# Patient Record
Sex: Female | Born: 1980 | Race: White | Hispanic: No | Marital: Married | Smoking: Former smoker
Health system: Southern US, Community
[De-identification: ages and names within clinical notes are randomized; demographics above are authoritative.]

## PROBLEM LIST (undated history)

## (undated) DIAGNOSIS — I10 Essential (primary) hypertension: Secondary | ICD-10-CM

## (undated) DIAGNOSIS — K219 Gastro-esophageal reflux disease without esophagitis: Secondary | ICD-10-CM

## (undated) DIAGNOSIS — D649 Anemia, unspecified: Secondary | ICD-10-CM

## (undated) DIAGNOSIS — E119 Type 2 diabetes mellitus without complications: Secondary | ICD-10-CM

## (undated) HISTORY — PX: TUBAL LIGATION: SHX77

## (undated) HISTORY — PX: ABDOMINAL HYSTERECTOMY: SHX81

## (undated) HISTORY — PX: WISDOM TOOTH EXTRACTION: SHX21

---

## 2005-08-05 ENCOUNTER — Ambulatory Visit: Payer: Self-pay | Admitting: Obstetrics & Gynecology

## 2005-09-15 ENCOUNTER — Observation Stay: Payer: Self-pay

## 2005-10-06 ENCOUNTER — Observation Stay: Payer: Self-pay | Admitting: Unknown Physician Specialty

## 2005-10-17 ENCOUNTER — Inpatient Hospital Stay: Payer: Self-pay | Admitting: Obstetrics & Gynecology

## 2007-01-30 ENCOUNTER — Inpatient Hospital Stay: Payer: Self-pay | Admitting: Obstetrics & Gynecology

## 2008-11-03 ENCOUNTER — Emergency Department: Payer: Self-pay | Admitting: Emergency Medicine

## 2012-10-16 ENCOUNTER — Emergency Department: Payer: Self-pay | Admitting: Emergency Medicine

## 2016-12-13 ENCOUNTER — Encounter: Payer: Self-pay | Admitting: Obstetrics & Gynecology

## 2017-02-01 ENCOUNTER — Telehealth: Payer: Self-pay | Admitting: Obstetrics & Gynecology

## 2017-02-01 NOTE — Telephone Encounter (Signed)
I spoke to the patient and gave Dr. Doreene Adas available June OR dates. She will call back when she decides which date will work. Patient was given my phone# and ext.

## 2017-03-02 NOTE — Telephone Encounter (Signed)
Patient is aware of H&P on 05/03/17 @ 3:50pm with a Pre-Admit phone interview, and OR on 05/11/17.

## 2017-04-04 ENCOUNTER — Encounter: Payer: Self-pay | Admitting: Obstetrics & Gynecology

## 2017-04-04 ENCOUNTER — Ambulatory Visit (INDEPENDENT_AMBULATORY_CARE_PROVIDER_SITE_OTHER): Payer: BC Managed Care – PPO | Admitting: Obstetrics & Gynecology

## 2017-04-04 VITALS — BP 138/90 | HR 106 | Ht 60.0 in | Wt 211.0 lb

## 2017-04-04 DIAGNOSIS — Z1371 Encounter for nonprocreative screening for genetic disease carrier status: Secondary | ICD-10-CM | POA: Diagnosis not present

## 2017-04-04 DIAGNOSIS — Z803 Family history of malignant neoplasm of breast: Secondary | ICD-10-CM | POA: Insufficient documentation

## 2017-04-04 NOTE — Progress Notes (Signed)
  HPI: She has strong family history of breast cancer and ius here to discuss BRCA results.  PMHx: She  has no past medical history on file. Also,  has a past surgical history that includes Tubal ligation and Cesarean section., family history includes Bone cancer in her paternal grandfather; Breast cancer in her maternal grandmother, other, and paternal grandmother; Lung cancer in her maternal grandfather; Ovarian cancer in her maternal aunt.,  reports that she has never smoked. She has never used smokeless tobacco. She reports that she does not drink alcohol or use drugs.  She currently has no medications in their medication list. Also, has No Known Allergies.  Review of Systems  All other systems reviewed and are negative.  Objective: BP 138/90   Pulse (!) 106   Ht 5' (1.524 m)   Wt 211 lb (95.7 kg)   LMP 03/23/2017   BMI 41.21 kg/m   Physical examination Constitutional NAD, Conversant  Skin No rashes, lesions or ulceration.   Extremities: Moves all appropriately.  Normal ROM for age. No lymphadenopathy.  Neuro: Grossly intact  Psych: Oriented to PPT.  Normal mood. Normal affect.   Assessment:  Family history of breast cancer  BRCA gene mutation negative   Plan No further treatments of evaluations needed Hysterectomy soon, OK to preserve ovaries Lifetime risk of 13 % average risk.  A total of 15 minutes were spent face-to-face with the patient during this encounter and over half of that time dealt with counseling and coordination of care.   Barnett Applebaum, MD, [facility], [facility] 04/04/2017  4:37 PM

## 2017-05-03 ENCOUNTER — Ambulatory Visit (INDEPENDENT_AMBULATORY_CARE_PROVIDER_SITE_OTHER): Payer: BC Managed Care – PPO | Admitting: Obstetrics & Gynecology

## 2017-05-03 VITALS — BP 130/80 | HR 90 | Ht 60.0 in | Wt 208.0 lb

## 2017-05-03 DIAGNOSIS — N920 Excessive and frequent menstruation with regular cycle: Secondary | ICD-10-CM | POA: Diagnosis not present

## 2017-05-03 DIAGNOSIS — Z1371 Encounter for nonprocreative screening for genetic disease carrier status: Secondary | ICD-10-CM | POA: Diagnosis not present

## 2017-05-03 NOTE — Progress Notes (Signed)
PRE-OPERATIVE HISTORY AND PHYSICAL EXAM  HPI:  Christie Wright is a 36 y.o. [phone]; she is being admitted for surgery related to abnormal uterine bleeding.  Pt has had freq and heavy periods, refractory to medical therapy.  Has declined ablation or other therapeutic interventions.  PMHx: No past medical history on file. Past Surgical History:  Procedure Laterality Date  . CESAREAN SECTION    . TUBAL LIGATION     Family History  Problem Relation Age of Onset  . Ovarian cancer Maternal Aunt   . Breast cancer Maternal Grandmother   . Lung cancer Maternal Grandfather   . Breast cancer Paternal Grandmother   . Bone cancer Paternal Grandfather   . Breast cancer Other    Social History  Substance Use Topics  . Smoking status: Never Smoker  . Smokeless tobacco: Never Used  . Alcohol use No   No outpatient prescriptions have been marked as taking for the 05/03/17 encounter (Office Visit) with Gae Dry, MD.   Allergies: Patient has no known allergies.  Review of Systems  Constitutional: Negative for chills, fever and malaise/fatigue.  HENT: Negative for congestion, sinus pain and sore throat.   Eyes: Negative for blurred vision and pain.  Respiratory: Negative for cough and wheezing.   Cardiovascular: Negative for chest pain and leg swelling.  Gastrointestinal: Negative for abdominal pain, constipation, diarrhea, heartburn, nausea and vomiting.  Genitourinary: Negative for dysuria, frequency, hematuria and urgency.  Musculoskeletal: Negative for back pain, joint pain, myalgias and neck pain.  Skin: Negative for itching and rash.  Neurological: Negative for dizziness, tremors and weakness.  Endo/Heme/Allergies: Does not bruise/bleed easily.  Psychiatric/Behavioral: Negative for depression. The patient is not nervous/anxious and does not have insomnia.     Objective: BP 130/80   Pulse 90   Ht 5' (1.524 m)   Wt 208 lb (94.3 kg)   BMI 40.62 kg/m   Filed Weights   05/03/17 1608  Weight: 208 lb (94.3 kg)   Physical Exam  Constitutional: She is oriented to person, place, and time. She appears well-developed and well-nourished. No distress.  Obesity  Genitourinary: Rectum normal, vagina normal and uterus normal. Pelvic exam was performed with patient supine. There is no rash or lesion on the right labia. There is no rash or lesion on the left labia. Vagina exhibits no lesion. No bleeding in the vagina. Right adnexum does not display mass and does not display tenderness. Left adnexum does not display mass and does not display tenderness. Cervix does not exhibit motion tenderness, lesion, friability or polyp.   Uterus is mobile and midaxial. Uterus is not enlarged or exhibiting a mass.  HENT:  Head: Normocephalic and atraumatic. Head is without laceration.  Right Ear: Hearing normal.  Left Ear: Hearing normal.  Nose: No epistaxis.  No foreign bodies.  Mouth/Throat: Uvula is midline, oropharynx is clear and moist and mucous membranes are normal.  Eyes: Pupils are equal, round, and reactive to light.  Neck: Normal range of motion. Neck supple. No thyromegaly present.  Cardiovascular: Normal rate and regular rhythm.  Exam reveals no gallop and no friction rub.   No murmur heard. Pulmonary/Chest: Effort normal and breath sounds normal. No respiratory distress. She has no wheezes. Right breast exhibits no mass, no skin change and no tenderness. Left breast exhibits no mass, no skin change and no tenderness.  Abdominal: Soft. Bowel sounds are normal. She exhibits no distension. There is no tenderness. There is no rebound.  Musculoskeletal: Normal range of motion.  Neurological: She is alert and oriented to person, place, and time. No cranial nerve deficit.  Skin: Skin is warm and dry.  Psychiatric: She has a normal mood and affect. Judgment normal.  Vitals reviewed.   Assessment: 1. Menorrhagia with regular cycle   2. BRCA gene mutation negative   Plan  TLH, BS, Cysto.  Alternative options discussed.  I have had a careful discussion with this patient about all the options available and the risk/benefits of each. I have fully informed this patient that surgery may subject her to a variety of discomforts and risks: She understands that most patients have surgery with little difficulty, but problems can happen ranging from minor to fatal. These include nausea, vomiting, pain, bleeding, infection, poor healing, hernia, or formation of adhesions. Unexpected reactions may occur from any drug or anesthetic given. Unintended injury may occur to other pelvic or abdominal structures such as Fallopian tubes, ovaries, bladder, ureter (tube from kidney to bladder), or bowel. Nerves going from the pelvis to the legs may be injured. Any such injury may require immediate or later additional surgery to correct the problem. Excessive blood loss requiring transfusion is very unlikely but possible. Dangerous blood clots may form in the legs or lungs. Physical and sexual activity will be restricted in varying degrees for an indeterminate period of time but most often 2-6 weeks.  Finally, she understands that it is impossible to list every possible undesirable effect and that the condition for which surgery is done is not always cured or significantly improved, and in rare cases may be even worse.Ample time was given to answer all questions.  Barnett Applebaum, MD, [facility], [facility] 05/03/2017  5:05 PM

## 2017-05-04 ENCOUNTER — Encounter
Admission: RE | Admit: 2017-05-04 | Discharge: 2017-05-04 | Disposition: A | Discharge status: Home / Self Care | Payer: BC Managed Care – PPO | Source: Ambulatory Visit | Attending: Obstetrics & Gynecology | Admitting: Obstetrics & Gynecology

## 2017-05-04 ENCOUNTER — Encounter: Payer: Self-pay | Admitting: *Deleted

## 2017-05-04 HISTORY — DX: Anemia, unspecified: D64.9

## 2017-05-04 HISTORY — DX: Gastro-esophageal reflux disease without esophagitis: K21.9

## 2017-05-04 NOTE — Patient Instructions (Signed)
  Your procedure is scheduled on: 05-11-17 THURSDAY Report to Same Day Surgery 2nd floor medical mall [facility] Entrance-take elevator on left to 2nd floor.  Check in with surgery information desk.) To find out your arrival time please call [phone] between 1PM - 3PM on  05-10-17 [facility]  Remember: Instructions that are not followed completely may result in serious medical risk, up to and including death, or upon the discretion of your surgeon and anesthesiologist your surgery may need to be rescheduled.    _x___ 1. Do not eat food or drink liquids after midnight. No gum chewing or hard candies.     __x__ 2. No Alcohol for 24 hours before or after surgery.   __x__3. No Smoking for 24 prior to surgery.   ____  4. Bring all medications with you on the day of surgery if instructed.    __x__ 5. Notify your doctor if there is any change in your medical condition     (cold, fever, infections).     Do not wear jewelry, make-up, hairpins, clips or nail polish.  Do not wear lotions, powders, or perfumes. You may wear deodorant.  Do not shave 48 hours prior to surgery. Men may shave face and neck.  Do not bring valuables to the hospital.    [facility] is not responsible for any belongings or valuables.               Contacts, dentures or bridgework may not be worn into surgery.  Leave your suitcase in the car. After surgery it may be brought to your room.  For patients admitted to the hospital, discharge time is determined by your treatment team.   Patients discharged the day of surgery will not be allowed to drive home.  You will need someone to drive you home and stay with you the night of your procedure.    Please read over the following fact sheets that you were given:      ____ Take anti-hypertensive (unless it includes a diuretic), cardiac, seizure, asthma,     anti-reflux and psychiatric medicines. These include:  1. NONE  2.  3.  4.  5.  6.  ____Fleets enema or  Magnesium Citrate as directed.   _x___ Use CHG Soap or sage wipes as directed on instruction sheet   ____ Use inhalers on the day of surgery and bring to hospital day of surgery  ____ Stop Metformin and Janumet 2 days prior to surgery.    ____ Take 1/2 of usual insulin dose the night before surgery and none on the morning  surgery.   ____ Follow recommendations from Cardiologist, Pulmonologist or PCP regarding stopping Aspirin, Coumadin, Pllavix ,Eliquis, Effient, or Pradaxa, and Pletal.  X____Stop Anti-inflammatories such as Advil, Aleve, Ibuprofen, MOTRIN, Naproxen, Naprosyn, Goodies powders or aspirin products NOW-OK to take Tylenol    ____ Stop supplements until after surgery.    ____ Bring C-Pap to the hospital.

## 2017-05-10 ENCOUNTER — Encounter
Admission: RE | Admit: 2017-05-10 | Discharge: 2017-05-10 | Disposition: A | Discharge status: Home / Self Care | Payer: BC Managed Care – PPO | Source: Ambulatory Visit | Attending: Obstetrics & Gynecology | Admitting: Obstetrics & Gynecology

## 2017-05-10 DIAGNOSIS — D259 Leiomyoma of uterus, unspecified: Secondary | ICD-10-CM | POA: Diagnosis not present

## 2017-05-10 DIAGNOSIS — N920 Excessive and frequent menstruation with regular cycle: Secondary | ICD-10-CM | POA: Diagnosis present

## 2017-05-10 DIAGNOSIS — N72 Inflammatory disease of cervix uteri: Secondary | ICD-10-CM | POA: Diagnosis not present

## 2017-05-10 DIAGNOSIS — Z803 Family history of malignant neoplasm of breast: Secondary | ICD-10-CM | POA: Diagnosis not present

## 2017-05-10 DIAGNOSIS — N838 Other noninflammatory disorders of ovary, fallopian tube and broad ligament: Secondary | ICD-10-CM | POA: Diagnosis not present

## 2017-05-10 LAB — TYPE AND SCREEN
ABO/RH(D): A POS
ANTIBODY SCREEN: NEGATIVE

## 2017-05-10 LAB — CBC
HCT: 40.7 % (ref 35.0–47.0)
HEMOGLOBIN: 13.9 g/dL (ref 12.0–16.0)
MCH: 30.3 pg (ref 26.0–34.0)
MCHC: 34.2 g/dL (ref 32.0–36.0)
MCV: 88.7 fL (ref 80.0–100.0)
PLATELETS: 270 10*3/uL (ref 150–440)
RBC: 4.59 MIL/uL (ref 3.80–5.20)
RDW: 12.8 % (ref 11.5–14.5)
WBC: 9 10*3/uL (ref 3.6–11.0)

## 2017-05-10 MED ORDER — CEFOXITIN SODIUM-DEXTROSE 2-2.2 GM-% IV SOLR (PREMIX)
2.0000 g | Freq: Once | INTRAVENOUS | Status: AC
  Administered 2017-05-11: 2 g via INTRAVENOUS

## 2017-05-11 ENCOUNTER — Ambulatory Visit: Payer: BC Managed Care – PPO | Admitting: Registered Nurse

## 2017-05-11 ENCOUNTER — Ambulatory Visit
Admission: RE | Admit: 2017-05-11 | Discharge: 2017-05-11 | Disposition: A | Discharge status: Home / Self Care | Payer: BC Managed Care – PPO | Source: Ambulatory Visit | Attending: Obstetrics & Gynecology | Admitting: Obstetrics & Gynecology

## 2017-05-11 ENCOUNTER — Encounter
Admission: RE | Disposition: A | Discharge status: Home / Self Care | Payer: Self-pay | Source: Ambulatory Visit | Attending: Obstetrics & Gynecology

## 2017-05-11 DIAGNOSIS — D259 Leiomyoma of uterus, unspecified: Secondary | ICD-10-CM | POA: Insufficient documentation

## 2017-05-11 DIAGNOSIS — N72 Inflammatory disease of cervix uteri: Secondary | ICD-10-CM | POA: Insufficient documentation

## 2017-05-11 DIAGNOSIS — Z803 Family history of malignant neoplasm of breast: Secondary | ICD-10-CM | POA: Insufficient documentation

## 2017-05-11 DIAGNOSIS — N920 Excessive and frequent menstruation with regular cycle: Secondary | ICD-10-CM | POA: Diagnosis not present

## 2017-05-11 DIAGNOSIS — N838 Other noninflammatory disorders of ovary, fallopian tube and broad ligament: Secondary | ICD-10-CM | POA: Insufficient documentation

## 2017-05-11 HISTORY — PX: CYSTOSCOPY: SHX5120

## 2017-05-11 HISTORY — PX: TOTAL LAPAROSCOPIC HYSTERECTOMY WITH SALPINGECTOMY: SHX6742

## 2017-05-11 LAB — POCT PREGNANCY, URINE: Preg Test, Ur: NEGATIVE

## 2017-05-11 LAB — ABO/RH: ABO/RH(D): A POS

## 2017-05-11 SURGERY — HYSTERECTOMY, TOTAL, LAPAROSCOPIC, WITH SALPINGECTOMY
Anesthesia: General | Laterality: Bilateral

## 2017-05-11 MED ORDER — SUGAMMADEX SODIUM 200 MG/2ML IV SOLN
INTRAVENOUS | Status: DC | PRN
  Administered 2017-05-11: 190 mg via INTRAVENOUS

## 2017-05-11 MED ORDER — LIDOCAINE HCL (PF) 2 % IJ SOLN
INTRAMUSCULAR | Status: AC
  Filled 2017-05-11: qty 2

## 2017-05-11 MED ORDER — FENTANYL CITRATE (PF) 250 MCG/5ML IJ SOLN
INTRAMUSCULAR | Status: AC
  Filled 2017-05-11: qty 5

## 2017-05-11 MED ORDER — FAMOTIDINE 20 MG PO TABS
ORAL_TABLET | ORAL | Status: AC
  Filled 2017-05-11: qty 1

## 2017-05-11 MED ORDER — PROMETHAZINE HCL 25 MG/ML IJ SOLN
INTRAMUSCULAR | Status: DC
Start: 2017-05-11 — End: 2017-05-11
  Filled 2017-05-11: qty 1

## 2017-05-11 MED ORDER — SUCCINYLCHOLINE CHLORIDE 20 MG/ML IJ SOLN
INTRAMUSCULAR | Status: AC
  Filled 2017-05-11: qty 1

## 2017-05-11 MED ORDER — CEFOXITIN SODIUM-DEXTROSE 2-2.2 GM-% IV SOLR (PREMIX)
INTRAVENOUS | Status: AC
  Filled 2017-05-11: qty 50

## 2017-05-11 MED ORDER — ONDANSETRON HCL 4 MG/2ML IJ SOLN
INTRAMUSCULAR | Status: DC | PRN
  Administered 2017-05-11: 4 mg via INTRAVENOUS

## 2017-05-11 MED ORDER — ACETAMINOPHEN NICU IV SYRINGE 10 MG/ML
INTRAVENOUS | Status: AC
  Filled 2017-05-11: qty 1

## 2017-05-11 MED ORDER — FENTANYL CITRATE (PF) 100 MCG/2ML IJ SOLN
INTRAMUSCULAR | Status: AC
  Administered 2017-05-11: 50 ug via INTRAVENOUS
  Filled 2017-05-11: qty 2

## 2017-05-11 MED ORDER — PROPOFOL 10 MG/ML IV BOLUS
INTRAVENOUS | Status: DC | PRN
  Administered 2017-05-11: 180 mg via INTRAVENOUS

## 2017-05-11 MED ORDER — LACTATED RINGERS IV SOLN
INTRAVENOUS | Status: DC
  Administered 2017-05-11 (×2): via INTRAVENOUS

## 2017-05-11 MED ORDER — OXYCODONE HCL 5 MG PO TABS
5.0000 mg | ORAL_TABLET | Freq: Once | ORAL | Status: AC | PRN
  Administered 2017-05-11: 5 mg via ORAL

## 2017-05-11 MED ORDER — OXYCODONE HCL 5 MG PO TABS
ORAL_TABLET | ORAL | Status: AC
  Filled 2017-05-11: qty 1

## 2017-05-11 MED ORDER — SUGAMMADEX SODIUM 200 MG/2ML IV SOLN
INTRAVENOUS | Status: AC
  Filled 2017-05-11: qty 2

## 2017-05-11 MED ORDER — DEXAMETHASONE SODIUM PHOSPHATE 10 MG/ML IJ SOLN
INTRAMUSCULAR | Status: DC | PRN
  Administered 2017-05-11: 10 mg via INTRAVENOUS

## 2017-05-11 MED ORDER — PROMETHAZINE HCL 25 MG/ML IJ SOLN
6.2500 mg | INTRAMUSCULAR | Status: DC | PRN
  Administered 2017-05-11: 6.25 mg via INTRAVENOUS

## 2017-05-11 MED ORDER — FENTANYL CITRATE (PF) 100 MCG/2ML IJ SOLN
25.0000 ug | INTRAMUSCULAR | Status: DC | PRN
  Administered 2017-05-11 (×3): 50 ug via INTRAVENOUS

## 2017-05-11 MED ORDER — BUPIVACAINE HCL (PF) 0.5 % IJ SOLN
INTRAMUSCULAR | Status: DC | PRN
  Administered 2017-05-11: 12 mL

## 2017-05-11 MED ORDER — KETOROLAC TROMETHAMINE 30 MG/ML IJ SOLN: 30.0000 mg | Freq: Four times a day (QID) | INTRAMUSCULAR | Status: DC

## 2017-05-11 MED ORDER — MIDAZOLAM HCL 2 MG/2ML IJ SOLN
INTRAMUSCULAR | Status: AC
  Filled 2017-05-11: qty 2

## 2017-05-11 MED ORDER — EPHEDRINE SULFATE 50 MG/ML IJ SOLN
INTRAMUSCULAR | Status: AC
  Filled 2017-05-11: qty 1

## 2017-05-11 MED ORDER — OXYCODONE HCL 5 MG/5ML PO SOLN: 5.0000 mg | Freq: Once | ORAL | Status: AC | PRN

## 2017-05-11 MED ORDER — ROCURONIUM BROMIDE 50 MG/5ML IV SOLN
INTRAVENOUS | Status: AC
  Filled 2017-05-11: qty 1

## 2017-05-11 MED ORDER — MEPERIDINE HCL 50 MG/ML IJ SOLN: 6.2500 mg | INTRAMUSCULAR | Status: DC | PRN

## 2017-05-11 MED ORDER — LIDOCAINE HCL (CARDIAC) 20 MG/ML IV SOLN
INTRAVENOUS | Status: DC | PRN
  Administered 2017-05-11: 80 mg via INTRAVENOUS

## 2017-05-11 MED ORDER — ACETAMINOPHEN 650 MG RE SUPP
650.0000 mg | RECTAL | Status: DC | PRN
  Filled 2017-05-11: qty 1

## 2017-05-11 MED ORDER — PHENYLEPHRINE HCL 10 MG/ML IJ SOLN
INTRAMUSCULAR | Status: AC
  Filled 2017-05-11: qty 1

## 2017-05-11 MED ORDER — MIDAZOLAM HCL 2 MG/2ML IJ SOLN
INTRAMUSCULAR | Status: DC | PRN
  Administered 2017-05-11: 2 mg via INTRAVENOUS

## 2017-05-11 MED ORDER — MORPHINE SULFATE (PF) 4 MG/ML IV SOLN: 1.0000 mg | INTRAVENOUS | Status: DC | PRN

## 2017-05-11 MED ORDER — OXYCODONE-ACETAMINOPHEN 5-325 MG PO TABS: 1.0000 | ORAL_TABLET | ORAL | 0 refills | Status: DC | PRN

## 2017-05-11 MED ORDER — ONDANSETRON HCL 4 MG/2ML IJ SOLN
INTRAMUSCULAR | Status: AC
  Filled 2017-05-11: qty 2

## 2017-05-11 MED ORDER — DEXAMETHASONE SODIUM PHOSPHATE 10 MG/ML IJ SOLN
INTRAMUSCULAR | Status: AC
  Filled 2017-05-11: qty 1

## 2017-05-11 MED ORDER — PROPOFOL 10 MG/ML IV BOLUS
INTRAVENOUS | Status: AC
  Filled 2017-05-11: qty 20

## 2017-05-11 MED ORDER — BUPIVACAINE HCL (PF) 0.5 % IJ SOLN
INTRAMUSCULAR | Status: AC
  Filled 2017-05-11: qty 30

## 2017-05-11 MED ORDER — FAMOTIDINE 20 MG PO TABS
20.0000 mg | ORAL_TABLET | Freq: Once | ORAL | Status: AC
  Administered 2017-05-11: 20 mg via ORAL

## 2017-05-11 MED ORDER — ACETAMINOPHEN 325 MG PO TABS: 650.0000 mg | ORAL_TABLET | ORAL | Status: DC | PRN

## 2017-05-11 MED ORDER — FENTANYL CITRATE (PF) 100 MCG/2ML IJ SOLN
INTRAMUSCULAR | Status: DC | PRN
  Administered 2017-05-11 (×4): 50 ug via INTRAVENOUS

## 2017-05-11 MED ORDER — ACETAMINOPHEN 10 MG/ML IV SOLN
INTRAVENOUS | Status: DC | PRN
  Administered 2017-05-11: 1000 mg via INTRAVENOUS

## 2017-05-11 MED ORDER — ROCURONIUM BROMIDE 100 MG/10ML IV SOLN
INTRAVENOUS | Status: DC | PRN
  Administered 2017-05-11: 5 mg via INTRAVENOUS
  Administered 2017-05-11: 35 mg via INTRAVENOUS
  Administered 2017-05-11: 10 mg via INTRAVENOUS

## 2017-05-11 MED ORDER — SODIUM CHLORIDE 0.9 % IJ SOLN
INTRAMUSCULAR | Status: AC
  Filled 2017-05-11: qty 20

## 2017-05-11 MED ORDER — SUCCINYLCHOLINE CHLORIDE 20 MG/ML IJ SOLN
INTRAMUSCULAR | Status: DC | PRN
  Administered 2017-05-11: 100 mg via INTRAVENOUS

## 2017-05-11 SURGICAL SUPPLY — 49 items
BAG URO DRAIN 2000ML W/SPOUT (MISCELLANEOUS) ×4 IMPLANT
BLADE SURG SZ11 CARB STEEL (BLADE) ×4 IMPLANT
CANISTER SUCT 1200ML W/VALVE (MISCELLANEOUS) ×4 IMPLANT
CATH FOLEY 2WAY  5CC 16FR (CATHETERS) ×2
CATH URTH 16FR FL 2W BLN LF (CATHETERS) ×2 IMPLANT
CHLORAPREP W/TINT 26ML (MISCELLANEOUS) ×4 IMPLANT
DEFOGGER SCOPE WARMER CLEARIFY (MISCELLANEOUS) ×4 IMPLANT
DERMABOND ADVANCED (GAUZE/BANDAGES/DRESSINGS) ×2
DERMABOND ADVANCED .7 DNX12 (GAUZE/BANDAGES/DRESSINGS) ×2 IMPLANT
DEVICE SUTURE ENDOST 10MM (ENDOMECHANICALS) ×4 IMPLANT
DRAPE CAMERA CLOSED 9X96 (DRAPES) ×4 IMPLANT
DRSG TEGADERM 2-3/8X2-3/4 SM (GAUZE/BANDAGES/DRESSINGS) IMPLANT
ENDOSTITCH 0 SINGLE 48 (SUTURE) ×20 IMPLANT
GAUZE SPONGE NON-WVN 2X2 STRL (MISCELLANEOUS) IMPLANT
GLOVE BIO SURGEON STRL SZ8 (GLOVE) ×20 IMPLANT
GLOVE INDICATOR 8.0 STRL GRN (GLOVE) ×20 IMPLANT
GOWN STRL REUS W/ TWL LRG LVL3 (GOWN DISPOSABLE) ×2 IMPLANT
GOWN STRL REUS W/ TWL XL LVL3 (GOWN DISPOSABLE) ×4 IMPLANT
GOWN STRL REUS W/TWL LRG LVL3 (GOWN DISPOSABLE) ×2
GOWN STRL REUS W/TWL XL LVL3 (GOWN DISPOSABLE) ×4
GRASPER SUT TROCAR 14GX15 (MISCELLANEOUS) ×4 IMPLANT
IRRIGATION STRYKERFLOW (MISCELLANEOUS) ×2 IMPLANT
IRRIGATOR STRYKERFLOW (MISCELLANEOUS) ×4
IV LACTATED RINGERS 1000ML (IV SOLUTION) ×4 IMPLANT
KIT PINK PAD W/HEAD ARE REST (MISCELLANEOUS) ×4
KIT PINK PAD W/HEAD ARM REST (MISCELLANEOUS) ×2 IMPLANT
KIT RM TURNOVER CYSTO AR (KITS) ×4 IMPLANT
LABEL OR SOLS (LABEL) ×4 IMPLANT
MANIPULATOR VCARE LG CRV RETR (MISCELLANEOUS) ×4 IMPLANT
MANIPULATOR VCARE SML CRV RETR (MISCELLANEOUS) IMPLANT
MANIPULATOR VCARE STD CRV RETR (MISCELLANEOUS) IMPLANT
NEEDLE VERESS 14GA 120MM (NEEDLE) ×4 IMPLANT
NS IRRIG 500ML POUR BTL (IV SOLUTION) ×4 IMPLANT
OCCLUDER COLPOPNEUMO (BALLOONS) ×4 IMPLANT
PACK GYN LAPAROSCOPIC (MISCELLANEOUS) ×4 IMPLANT
PAD OB MATERNITY 4.3X12.25 (PERSONAL CARE ITEMS) ×4 IMPLANT
PAD PREP 24X41 OB/GYN DISP (PERSONAL CARE ITEMS) ×4 IMPLANT
SCISSORS METZENBAUM CVD 33 (INSTRUMENTS) ×4 IMPLANT
SET CYSTO W/LG BORE CLAMP LF (SET/KITS/TRAYS/PACK) ×4 IMPLANT
SHEARS HARMONIC ACE PLUS 36CM (ENDOMECHANICALS) ×4 IMPLANT
SLEEVE ENDOPATH XCEL 5M (ENDOMECHANICALS) ×4 IMPLANT
SPONGE LAP 18X18 5 PK (GAUZE/BANDAGES/DRESSINGS) IMPLANT
SPONGE VERSALON 2X2 STRL (MISCELLANEOUS)
SUT VIC AB 0 CT1 36 (SUTURE) ×4 IMPLANT
SYR 50ML LL SCALE MARK (SYRINGE) ×4 IMPLANT
SYRINGE 10CC LL (SYRINGE) ×4 IMPLANT
TROCAR ENDO BLADELESS 11MM (ENDOMECHANICALS) ×4 IMPLANT
TROCAR XCEL NON-BLD 5MMX100MML (ENDOMECHANICALS) ×4 IMPLANT
TUBING INSUF HEATED (TUBING) ×4 IMPLANT

## 2017-05-11 NOTE — Anesthesia Preprocedure Evaluation (Signed)
Anesthesia Evaluation  Patient identified by MRN, date of birth, ID band Patient awake    Reviewed: Allergy & Precautions, NPO status , Patient's Chart, lab work & pertinent test results  History of Anesthesia Complications Negative for: history of anesthetic complications  Airway Mallampati: III  TM Distance: >3 FB Neck ROM: Full    Dental no notable dental hx.    Pulmonary neg sleep apnea, neg COPD, former smoker,    breath sounds clear to auscultation- rhonchi (-) wheezing      Cardiovascular Exercise Tolerance: Good (-) hypertension(-) CAD and (-) Past MI  Rhythm:Regular Rate:Normal - Systolic murmurs and - Diastolic murmurs    Neuro/Psych negative neurological ROS  negative psych ROS   GI/Hepatic Neg liver ROS, GERD  ,  Endo/Other  negative endocrine ROSneg diabetes  Renal/GU negative Renal ROS     Musculoskeletal negative musculoskeletal ROS (+)   Abdominal (+) + obese,   Peds  Hematology  (+) anemia ,   Anesthesia Other Findings Past Medical History: No date: Anemia No date: GERD (gastroesophageal reflux disease)   Reproductive/Obstetrics                             Anesthesia Physical Anesthesia Plan  ASA: II  Anesthesia Plan: General   Post-op Pain Management:    Induction: Intravenous  PONV Risk Score and Plan: 2 and Ondansetron, Dexamethasone, Propofol and Midazolam  Airway Management Planned: Oral ETT  Additional Equipment:   Intra-op Plan:   Post-operative Plan: Extubation in OR  Informed Consent: I have reviewed the patients History and Physical, chart, labs and discussed the procedure including the risks, benefits and alternatives for the proposed anesthesia with the patient or authorized representative who has indicated his/her understanding and acceptance.   Dental advisory given  Plan Discussed with: CRNA and Anesthesiologist  Anesthesia Plan  Comments:         Anesthesia Quick Evaluation

## 2017-05-11 NOTE — Op Note (Signed)
Operative Report:  PRE-OP DIAGNOSIS: menorrhagia   POST-OP DIAGNOSIS: menorrhagia   PROCEDURE: Procedure(s): HYSTERECTOMY TOTAL LAPAROSCOPIC WITH SALPINGECTOMY CYSTOSCOPY  SURGEON: Barnett Applebaum, MD, FACOG  ASSISTANTGeorgianne Fick   ANESTHESIA: General endotracheal anesthesia  ESTIMATED BLOOD LOSS: less than 50   SPECIMENS: Uterus, Tubes.  COMPLICATIONS: None  DISPOSITION: stable to PACU  FINDINGS: Intraabdominal adhesions were noted. Min adhesions mostly to side wall not involving uterus or tubes.  Ovaries normal.  Cysto normal with patent ureters.  PROCEDURE:  The patient was taken to the OR where anesthesia was administed. She was prepped and draped in the normal sterile fashion in the dorsal lithotomy position in the [place] stirrups. A time out was performed. A Graves speculum was inserted, the cervix was grasped with a single tooth tenaculum and the endometrial cavity was sounded. The cervix was progressively dilated to a size 18 [place] with [employer] dilators. A V-Care uterine manipulator was inserted in the usual fashion without incident. Gloves were changed and attention was turned to the abdomen.   An infraumbilical transverse 43mm skin incision was made with the scalpel after local anesthesia applied to the skin. A Veress-step needle was inserted in the usual fashion and confirmed using the hanging drop technique. A pneumoperitoneum was obtained by insufflation of CO2 (opening pressure of 23mmHg) to 50mmHg. A diagnostic laparoscopy was performed yielding the previously described findings. Attention was turned to the left lower quadrant where after visualization of the inferior epigastric vessels a 32mm skin incision was made with the scalpel. A 5 mm laparoscopic port was inserted. The same procedure was repeated in the right lower quadrant with a 31mm trocar. Attention was turned to the left aspect of the uterus, where after visualization of the ureter, the round ligament was coagulated and  transected using the 69mm Harmonic Scapel. The anterior and posterior leafs of the broad ligament were dissected off as the anterior one was coagulated and transected in a caudal direction towards the cuff of the uterine manipulator.  Attention was then turned to the left fallopian tube which was recognized by visualization of the fimbria. The tube is excised to its attachment to the uterus. The uterine-ovarian ligament and its blood vessels were carefully coagulated and transected using the Harmonic scapel.  Attention was turned to the right aspect of the uterus where the same procedure was performed.  The vesicouterine reflection of the peritoneum was dissected with the harmonic scapel and the bladder flap was created bluntly.  The uterine vessels were coagulated and transected bilaterally using first bipolar cautery and then the harmonic scapel. A 360 degree, circumferential colpotomy was done to completely amputate the uterus with cervix and tubes. Once the specimen was amputated it was delivered through the vagina.   The colpotomy was repaired in a simple interrupted fashion using a 0-Polysorb suture with an endo-stitch device.  Vaginal exam confirms complete closure.  The cavity was copiously irrigated. A survey of the pelvic cavity revealed adequate hemostasis and no injury to bowel, bladder, or ureter.   A diagnostic cystoscopy was performed using saline distension of bladder with no lesions or injuries noted.  Bilateral urine flow from each ureteral orifice is visualized.  At this point the procedure was finalized. All the instruments were removed from the patient's body. Gas was expelled and patient is leveled.  Incisions are closed with skin adhesive.    Patient goes to recovery room in stable condition.  All sponge, instrument, and needle counts are correct x2.     Eddie Dibbles  Kenton Kingfisher, MD, [facility], [facility] 05/11/2017  12:20 PM

## 2017-05-11 NOTE — H&P (Signed)
History and Physical Interval Note:  05/11/2017 8:20 AM  Christie Wright  has presented today for surgery, with the diagnosis of menorrhagia  The various methods of treatment have been discussed with the patient and family. After consideration of risks, benefits and other options for treatment, the patient has consented to  Procedure(s): HYSTERECTOMY TOTAL LAPAROSCOPIC WITH SALPINGECTOMY (Bilateral) as a surgical intervention .  The patient's history has been reviewed, patient examined, no change in status, stable for surgery.  Pt has the following beta blocker history-  Not taking Beta Blocker.  I have reviewed the patient's chart and labs.  Questions were answered to the patient's satisfaction.    Hoyt Koch

## 2017-05-11 NOTE — Discharge Instructions (Addendum)
Total Laparoscopic Hysterectomy, Care After °Refer to this sheet in the next few weeks. These instructions provide you with information on caring for yourself after your procedure. Your health care provider may also give you more specific instructions. Your treatment has been planned according to current medical practices, but problems sometimes occur. Call your health care provider if you have any problems or questions after your procedure. °What can I expect after the procedure? °· Pain and bruising at the incision sites. You will be given pain medicine to control it. °· Menopausal symptoms such as hot flashes, night sweats, and insomnia if your ovaries were removed. °· Sore throat from the breathing tube that was inserted during surgery. °Follow these instructions at home: °· Only take over-the-counter or prescription medicines for pain, discomfort, or fever as directed by your health care provider. °· Do not take aspirin. It can cause bleeding. °· Do not drive when taking pain medicine. °· Follow your health care provider's advice regarding diet, exercise, lifting, driving, and general activities. °· Resume your usual diet as directed and allowed. °· Get plenty of rest and sleep. °· Do not douche, use tampons, or have sexual intercourse for at least 6 weeks, or until your health care provider gives you permission. °· Change your bandages (dressings) as directed by your health care provider. °· Monitor your temperature and notify your health care provider of a fever. °· Take showers instead of baths for 2-3 weeks. °· Do not drink alcohol until your health care provider gives you permission. °· If you develop constipation, you may take a mild laxative with your health care provider's permission. Bran foods may help with constipation problems. Drinking enough fluids to keep your urine clear or pale yellow may help as well. °· Try to have someone home with you for 1-2 weeks to help around the house. °· Keep all of  your follow-up appointments as directed by your health care provider. °Contact a health care provider if: °· You have swelling, redness, or increasing pain around your incision sites. °· You have pus coming from your incision. °· You notice a bad smell coming from your incision. °· Your incision breaks open. °· You feel dizzy or lightheaded. °· You have pain or bleeding when you urinate. °· You have persistent diarrhea. °· You have persistent nausea and vomiting. °· You have abnormal vaginal discharge. °· You have a rash. °· You have any type of abnormal reaction or develop an allergy to your medicine. °· You have poor pain control with your prescribed medicine. °Get help right away if: °· You have chest pain or shortness of breath. °· You have severe abdominal pain that is not relieved with pain medicine. °· You have pain or swelling in your legs. °This information is not intended to replace advice given to you by your health care provider. Make sure you discuss any questions you have with your health care provider. °Document Released: 09/04/2013 Document Revised: 04/21/2016 Document Reviewed: 06/04/2013 °Elsevier Interactive Patient Education © 2017 [employer] ° °AMBULATORY SURGERY  °DISCHARGE INSTRUCTIONS ° ° °1) The drugs that you were given will stay in your system until tomorrow so for the next 24 hours you should not: ° °A) Drive an automobile °B) Make any legal decisions °C) Drink any alcoholic beverage ° ° °2) You may resume regular meals tomorrow.  Today it is better to start with liquids and gradually work up to solid foods. ° °You may eat anything you prefer, but it is better   to start with liquids, then soup and crackers, and gradually work up to solid foods. ° ° °3) Please notify your doctor immediately if you have any unusual bleeding, trouble breathing, redness and pain at the surgery site, drainage, fever, or pain not relieved by medication. ° ° ° °4) Additional Instructions: ° ° ° ° ° ° ° °Please  contact your physician with any problems or Same Day Surgery at [phone], Monday through Friday 6 am to 4 pm, or [place] at [place] Main number at [phone]. ° °

## 2017-05-11 NOTE — OR Nursing (Signed)
Phenergan 6.25mg  IV given for naus/vomiting

## 2017-05-11 NOTE — Transfer of Care (Signed)
Immediate Anesthesia Transfer of Care Note  Patient: Christie Wright  Procedure(s) Performed: Procedure(s): HYSTERECTOMY TOTAL LAPAROSCOPIC WITH SALPINGECTOMY (Bilateral) CYSTOSCOPY  Patient Location: PACU  Anesthesia Type:General  Level of Consciousness: awake, alert  and oriented  Airway & Oxygen Therapy: Patient Spontanous Breathing and Patient connected to face mask oxygen  Post-op Assessment: Report given to RN and Post -op Vital signs reviewed and stable  Post vital signs: Reviewed and stable  Last Vitals:  Vitals:   05/11/17 0743 05/11/17 1231  BP: (!) 141/88 121/79  Pulse: 98 100  Resp: 18 18  Temp: 37.4 C 36.5 C    Last Pain:  Vitals:   05/11/17 0743  TempSrc: Tympanic         Complications: No apparent anesthesia complications

## 2017-05-11 NOTE — Anesthesia Postprocedure Evaluation (Signed)
Anesthesia Post Note  Patient: Christie Wright  Procedure(s) Performed: Procedure(s) (LRB): HYSTERECTOMY TOTAL LAPAROSCOPIC WITH SALPINGECTOMY (Bilateral) CYSTOSCOPY  Patient location during evaluation: PACU Anesthesia Type: General Level of consciousness: awake and alert and oriented Pain management: pain level controlled Vital Signs Assessment: post-procedure vital signs reviewed and stable Respiratory status: spontaneous breathing, nonlabored ventilation and respiratory function stable Cardiovascular status: blood pressure returned to baseline and stable Postop Assessment: no signs of nausea or vomiting Anesthetic complications: no     Last Vitals:  Vitals:   05/11/17 1315 05/11/17 1330  BP: 116/75 (!) 141/78  Pulse: 94 98  Resp: 18 15  Temp:  36.4 C    Last Pain:  Vitals:   05/11/17 1330  TempSrc:   PainSc: 3                  Pratyush Ammon

## 2017-05-11 NOTE — Anesthesia Post-op Follow-up Note (Cosign Needed)
Anesthesia QCDR form completed.        

## 2017-05-11 NOTE — Anesthesia Procedure Notes (Signed)
Procedure Name: Intubation Date/Time: 05/11/2017 10:42 AM Performed by: Hedda Slade Pre-anesthesia Checklist: Patient identified, Patient being monitored, Timeout performed, Emergency Drugs available and Suction available Patient Re-evaluated:Patient Re-evaluated prior to inductionOxygen Delivery Method: Circle system utilized Preoxygenation: Pre-oxygenation with 100% oxygen Intubation Type: IV induction Ventilation: Mask ventilation without difficulty and Oral airway inserted - appropriate to patient size Laryngoscope Size: Mac and 3 Grade View: Grade I Tube type: Oral Tube size: 7.0 mm Number of attempts: 1 Airway Equipment and Method: Stylet Placement Confirmation: ETT inserted through vocal cords under direct vision,  positive ETCO2 and breath sounds checked- equal and bilateral Secured at: 21 cm Tube secured with: Tape Dental Injury: Teeth and Oropharynx as per pre-operative assessment

## 2017-05-11 NOTE — OR Nursing (Signed)
Slept for approx 50 min after phenergan, denies nausea at present, to BR and back to chair, toler well, iv d/c'd and d'c'd to home.

## 2017-05-12 ENCOUNTER — Encounter: Payer: Self-pay | Admitting: Obstetrics & Gynecology

## 2017-05-12 LAB — SURGICAL PATHOLOGY

## 2017-05-18 ENCOUNTER — Ambulatory Visit (INDEPENDENT_AMBULATORY_CARE_PROVIDER_SITE_OTHER): Payer: BC Managed Care – PPO | Admitting: Obstetrics & Gynecology

## 2017-05-18 ENCOUNTER — Encounter: Payer: Self-pay | Admitting: Obstetrics & Gynecology

## 2017-05-18 VITALS — BP 130/90 | HR 96 | Ht 60.0 in | Wt 204.0 lb

## 2017-05-18 DIAGNOSIS — T814XXS Infection following a procedure, sequela: Secondary | ICD-10-CM

## 2017-05-18 DIAGNOSIS — IMO0001 Reserved for inherently not codable concepts without codable children: Secondary | ICD-10-CM

## 2017-05-18 NOTE — Progress Notes (Signed)
  Postoperative Follow-up Patient presents post op from laparoscopic assisted vaginal hysterectomy for abnormal uterine bleeding, 1 week ago.  Left incisional concern today.  Subjective: Patient reports marked improvement in her preop symptoms. Eating a regular diet without difficulty. The patient is not having any pain.  Activity: normal activities of daily living. Patient reports vaginal sx's of None  Objective: BP 130/90   Pulse 96   Ht 5' (1.524 m)   Wt 204 lb (92.5 kg)   LMP 04/20/2017 (Exact Date)   BMI 39.84 kg/m  Physical Exam  Constitutional: She is oriented to person, place, and time. She appears well-developed and well-nourished. No distress.  Cardiovascular: Normal rate.   Pulmonary/Chest: Effort normal.  Abdominal: Soft. She exhibits no distension. There is no tenderness.  Incision Healing Well Left inc w mild inflammation and separation and bruising   Musculoskeletal: Normal range of motion.  Neurological: She is alert and oriented to person, place, and time. No cranial nerve deficit.  Skin: Skin is warm and dry.  Psychiatric: She has a normal mood and affect.    Assessment: s/p :  total laparoscopic hysterectomy with bilateral salpingectomy stable  Plan: Patient has done well after surgery with no apparent complications.  I have discussed the post-operative course to date, and the expected progress moving forward.  The patient understands what complications to be concerned about.  I will see the patient in routine follow up, or sooner if needed.    Activity plan: No heavy lifting. Pelvic rest  Keep inc clean and dry  Hoyt Koch 05/18/2017, 1:27 PM

## 2017-05-24 ENCOUNTER — Ambulatory Visit: Payer: BC Managed Care – PPO | Admitting: Obstetrics & Gynecology

## 2017-06-22 ENCOUNTER — Ambulatory Visit (INDEPENDENT_AMBULATORY_CARE_PROVIDER_SITE_OTHER): Payer: BC Managed Care – PPO | Admitting: Obstetrics & Gynecology

## 2017-06-22 ENCOUNTER — Encounter: Payer: Self-pay | Admitting: Obstetrics & Gynecology

## 2017-06-22 ENCOUNTER — Ambulatory Visit: Payer: BC Managed Care – PPO | Admitting: Obstetrics & Gynecology

## 2017-06-22 VITALS — BP 134/94 | HR 105 | Wt 204.0 lb

## 2017-06-22 VITALS — BP 134/94 | HR 105 | Ht 60.0 in | Wt 204.0 lb

## 2017-06-22 DIAGNOSIS — N920 Excessive and frequent menstruation with regular cycle: Secondary | ICD-10-CM

## 2017-06-22 NOTE — Progress Notes (Signed)
  Postoperative Follow-up Patient presents post op from laparoscopic assisted vaginal hysterectomy for abnormal uterine bleeding, 6 weeks ago.  Subjective: Patient reports marked improvement in her preop symptoms. Eating a regular diet without difficulty. The patient is not having any pain.  Activity: normal activities of daily living. Patient reports vaginal sx's of None  Objective: BP (!) 134/94   Pulse (!) 105   Ht 5' (1.524 m)   Wt 204 lb (92.5 kg)   LMP 04/20/2017 (Exact Date)   BMI 39.84 kg/m  Physical Exam  Constitutional: She is oriented to person, place, and time. She appears well-developed and well-nourished. No distress.  Genitourinary: Rectum normal and vagina normal. Pelvic exam was performed with patient supine. There is no rash, tenderness or lesion on the right labia. There is no rash, tenderness or lesion on the left labia. No erythema or bleeding in the vagina. Right adnexum does not display mass and does not display tenderness. Left adnexum does not display mass and does not display tenderness.  Genitourinary Comments: Cervix and uterus absent. Vaginal cuff healing well.  Cardiovascular: Normal rate.   Pulmonary/Chest: Effort normal.  Abdominal: Soft. She exhibits no distension. There is no tenderness.  Incision healing well.  Musculoskeletal: Normal range of motion.  Neurological: She is alert and oriented to person, place, and time. No cranial nerve deficit.  Skin: Skin is warm and dry.  Psychiatric: She has a normal mood and affect.    Assessment: s/p :  total laparoscopic hysterectomy with bilateral salpingectomy stable  Plan: Patient has done well after surgery with no apparent complications.  I have discussed the post-operative course to date, and the expected progress moving forward.  The patient understands what complications to be concerned about.  I will see the patient in routine follow up, or sooner if needed.    Activity plan: No  restriction.  Hoyt Koch 06/22/2017, 1:38 PM

## 2017-06-23 NOTE — Progress Notes (Signed)
  Postoperative Follow-up Patient presents post op from laparoscopic assisted vaginal hysterectomy for abnormal uterine bleeding, 6 weeks ago.  Subjective: Patient reports marked improvement in her preop symptoms. Eating a regular diet without difficulty. The patient is not having any pain.  Activity: normal activities of daily living. Patient reports vaginal sx's of None  Objective: BP (!) 134/94   Pulse (!) 105   Ht 5' (1.524 m)   Wt 204 lb (92.5 kg)   LMP 04/20/2017 (Exact Date)   BMI 39.84 kg/m  Physical Exam  Constitutional: She is oriented to person, place, and time. She appears well-developed and well-nourished. No distress.  Genitourinary: Rectum normal and vagina normal. Pelvic exam was performed with patient supine. There is no rash, tenderness or lesion on the right labia. There is no rash, tenderness or lesion on the left labia. No erythema or bleeding in the vagina. Right adnexum does not display mass and does not display tenderness. Left adnexum does not display mass and does not display tenderness.  Genitourinary Comments: Cervix and uterus absent. Vaginal cuff healing well.  Cardiovascular: Normal rate.   Pulmonary/Chest: Effort normal.  Abdominal: Soft. She exhibits no distension. There is no tenderness.  Incision healing well.  Musculoskeletal: Normal range of motion.  Neurological: She is alert and oriented to person, place, and time. No cranial nerve deficit.  Skin: Skin is warm and dry.  Psychiatric: She has a normal mood and affect.    Assessment: s/p :  total laparoscopic hysterectomy with bilateral salpingectomy stable  Plan: Patient has done well after surgery with no apparent complications.  I have discussed the post-operative course to date, and the expected progress moving forward.  The patient understands what complications to be concerned about.  I will see the patient in routine follow up, or sooner if needed.    Activity plan: No  restriction.  Hoyt Koch 06/22/2017, 1:38 PM

## 2022-02-21 ENCOUNTER — Other Ambulatory Visit: Payer: Self-pay

## 2022-02-21 ENCOUNTER — Emergency Department: Payer: BC Managed Care – PPO

## 2022-02-21 ENCOUNTER — Emergency Department
Admission: EM | Admit: 2022-02-21 | Discharge: 2022-02-21 | Disposition: A | Discharge status: Home / Self Care | Payer: BC Managed Care – PPO | Attending: Emergency Medicine | Admitting: Emergency Medicine

## 2022-02-21 DIAGNOSIS — I10 Essential (primary) hypertension: Secondary | ICD-10-CM | POA: Diagnosis not present

## 2022-02-21 DIAGNOSIS — R0789 Other chest pain: Secondary | ICD-10-CM | POA: Diagnosis not present

## 2022-02-21 LAB — CBC
HCT: 44.8 % (ref 36.0–46.0)
Hemoglobin: 14.8 g/dL (ref 12.0–15.0)
MCH: 29.9 pg (ref 26.0–34.0)
MCHC: 33 g/dL (ref 30.0–36.0)
MCV: 90.5 fL (ref 80.0–100.0)
Platelets: 287 10*3/uL (ref 150–400)
RBC: 4.95 MIL/uL (ref 3.87–5.11)
RDW: 11.5 % (ref 11.5–15.5)
WBC: 7.8 10*3/uL (ref 4.0–10.5)
nRBC: 0 % (ref 0.0–0.2)

## 2022-02-21 LAB — BASIC METABOLIC PANEL
Anion gap: 10 (ref 5–15)
BUN: 9 mg/dL (ref 6–20)
CO2: 27 mmol/L (ref 22–32)
Calcium: 9.4 mg/dL (ref 8.9–10.3)
Chloride: 101 mmol/L (ref 98–111)
Creatinine, Ser: 0.64 mg/dL (ref 0.44–1.00)
GFR, Estimated: >60 mL/min (ref >60)
Glucose, Bld: 144 mg/dL — ABNORMAL HIGH (ref 70–99)
Potassium: 3.6 mmol/L (ref 3.5–5.1)
Sodium: 138 mmol/L (ref 135–145)

## 2022-02-21 LAB — TROPONIN I (HIGH SENSITIVITY): Troponin I (High Sensitivity): 4 ng/L (ref <18)

## 2022-02-21 NOTE — ED Provider Notes (Signed)
? ?[facility] ?Provider Note ? ? ? Event Date/Time  ? First MD Initiated Contact with Patient 02/21/22 1049   ?  (approximate) ? ? ?History  ? ?Hypertension and Chest Pain ? ? ?HPI ? ?Christie Wright is a 41 y.o. female with a history of anemia, GERD who presents with complaints of elevated blood pressure.  She also reports some very mild chest discomfort which has been intermittent over the last week.  Denies calf pain or swelling.  No nausea or vomiting.  Denies a history of high blood pressure in the past. ?  ? ? ?Physical Exam  ? ?Triage Vital Signs: ?ED Triage Vitals  ?Enc Vitals Group  ?   BP 02/21/22 1041 (!) 142/98  ?   Pulse Rate 02/21/22 1041 93  ?   Resp 02/21/22 1041 16  ?   Temp 02/21/22 1041 98.3 ?F (36.8 ?C)  ?   Temp Source 02/21/22 1041 Oral  ?   SpO2 02/21/22 1041 97 %  ?   Weight 02/21/22 1042 91.6 kg (202 lb)  ?   Height 02/21/22 1042 1.524 m (5')  ?   Head Circumference --   ?   Peak Flow --   ?   Pain Score 02/21/22 1041 0  ?   Pain Loc --   ?   Pain Edu? --   ?   Excl. in [place]? --   ? ? ?Most recent vital signs: ?Vitals:  ? 02/21/22 1041 02/21/22 1207  ?BP: (!) 142/98 126/78  ?Pulse: 93 88  ?Resp: 16 16  ?Temp: 98.3 ?F (36.8 ?C)   ?SpO2: 97%   ? ? ? ?General: Awake, no distress.  ?CV:  Good peripheral perfusion.  No chest wall tenderness palpation ?Resp:  Normal effort.  Clear auscultation bilaterally ?Abd:  No distention.  ?Other:  No lower extremity swelling or edema ? ? ?ED Results / Procedures / Treatments  ? ?Labs ?(all labs ordered are listed, but only abnormal results are displayed) ?Labs Reviewed  ?BASIC METABOLIC PANEL - Abnormal; Notable for the following components:  ?    Result Value  ? Glucose, Bld 144 (*)   ? All other components within normal limits  ?CBC  ?TROPONIN I (HIGH SENSITIVITY)  ?TROPONIN I (HIGH SENSITIVITY)  ? ? ? ?EKG ? ?ED ECG REPORT ?I, Lavonia Drafts, the attending physician, personally viewed and interpreted this ECG. ? ?Date:  02/21/2022 ? ?Rhythm: normal sinus rhythm ?QRS Axis: normal ?Intervals: normal ?ST/T Wave abnormalities: normal ?Narrative Interpretation: no evidence of acute ischemia ? ? ? ?RADIOLOGY ?Chest x-ray viewed and interpreted by me, no acute abnormality ? ? ? ?PROCEDURES: ? ?Critical Care performed:  ? ?Procedures ? ? ?MEDICATIONS ORDERED IN ED: ?Medications - No data to display ? ? ?IMPRESSION / MDM / ASSESSMENT AND PLAN / ED COURSE  ?I reviewed the triage vital signs and the nursing notes. ? ? ?Patient well-appearing and in no acute distress, she is here primarily because of an elevated blood pressure reading which concerned her.  Her blood pressure has improved here without intervention. ? ?EKG and lab work are quite reassuring, high sensitive troponin, BMP and CBC are normal. ? ?Chest x-ray is without evidence of acute abnormality. ? ?Blood pressure continue to trend down without intervention, at time of discharge blood pressure 126/78. ? ?Recommended keeping blood pressure log, following up with PCP.  If any change in symptoms return to the emergency department ? ? ? ?  ? ? ?  FINAL CLINICAL IMPRESSION(S) / ED DIAGNOSES  ? ?Final diagnoses:  ?Primary hypertension  ? ? ? ?Rx / DC Orders  ? ?ED Discharge Orders   ? ? None  ? ?  ? ? ? ?Note:  This document was prepared using Dragon voice recognition software and may include unintentional dictation errors. ?  ?Lavonia Drafts, MD ?02/21/22 1316 ? ?

## 2022-02-21 NOTE — ED Triage Notes (Signed)
States noticed BP to be elevated since last Wednesday.  No known history of HTN.  Sent from [facility] for ED evaluation. ? ?Patient is AAOx3.  Skin warm and dry. NAD ?

## 2022-02-21 NOTE — ED Triage Notes (Addendum)
Pt c/o chest pressure/tightness since last Wednesday night, some indigestion, and was concerned that she has been getting high reading on her b/p with no hx of HTN, 169/126 this morning at work. States last Wednesday night while laying down felt her heart racing until she fell asleep ?

## 2022-03-07 ENCOUNTER — Encounter: Payer: Self-pay | Admitting: Internal Medicine

## 2022-03-07 ENCOUNTER — Ambulatory Visit (INDEPENDENT_AMBULATORY_CARE_PROVIDER_SITE_OTHER): Payer: BC Managed Care – PPO | Admitting: Internal Medicine

## 2022-03-07 VITALS — BP 140/92 | HR 90 | Temp 98.0°F | Ht 60.0 in | Wt 207.0 lb

## 2022-03-07 DIAGNOSIS — Z1159 Encounter for screening for other viral diseases: Secondary | ICD-10-CM | POA: Diagnosis not present

## 2022-03-07 DIAGNOSIS — E1159 Type 2 diabetes mellitus with other circulatory complications: Secondary | ICD-10-CM

## 2022-03-07 DIAGNOSIS — Z8249 Family history of ischemic heart disease and other diseases of the circulatory system: Secondary | ICD-10-CM

## 2022-03-07 DIAGNOSIS — Z7289 Other problems related to lifestyle: Secondary | ICD-10-CM

## 2022-03-07 DIAGNOSIS — R5383 Other fatigue: Secondary | ICD-10-CM

## 2022-03-07 DIAGNOSIS — Z1231 Encounter for screening mammogram for malignant neoplasm of breast: Secondary | ICD-10-CM

## 2022-03-07 DIAGNOSIS — Z803 Family history of malignant neoplasm of breast: Secondary | ICD-10-CM

## 2022-03-07 DIAGNOSIS — R7301 Impaired fasting glucose: Secondary | ICD-10-CM

## 2022-03-07 DIAGNOSIS — R030 Elevated blood-pressure reading, without diagnosis of hypertension: Secondary | ICD-10-CM | POA: Diagnosis not present

## 2022-03-07 DIAGNOSIS — Z87891 Personal history of nicotine dependence: Secondary | ICD-10-CM

## 2022-03-07 DIAGNOSIS — Z114 Encounter for screening for human immunodeficiency virus [HIV]: Secondary | ICD-10-CM

## 2022-03-07 DIAGNOSIS — E785 Hyperlipidemia, unspecified: Secondary | ICD-10-CM

## 2022-03-07 DIAGNOSIS — I152 Hypertension secondary to endocrine disorders: Secondary | ICD-10-CM

## 2022-03-07 DIAGNOSIS — I10 Essential (primary) hypertension: Secondary | ICD-10-CM

## 2022-03-07 DIAGNOSIS — N920 Excessive and frequent menstruation with regular cycle: Secondary | ICD-10-CM

## 2022-03-07 DIAGNOSIS — E669 Obesity, unspecified: Secondary | ICD-10-CM

## 2022-03-07 DIAGNOSIS — E1169 Type 2 diabetes mellitus with other specified complication: Secondary | ICD-10-CM

## 2022-03-07 LAB — COMPREHENSIVE METABOLIC PANEL
ALT: 22 U/L (ref 0–35)
AST: 14 U/L (ref 0–37)
Albumin: 4.5 g/dL (ref 3.5–5.2)
Alkaline Phosphatase: 87 U/L (ref 39–117)
BUN: 7 mg/dL (ref 6–23)
CO2: 28 mEq/L (ref 19–32)
Calcium: 9.3 mg/dL (ref 8.4–10.5)
Chloride: 100 mEq/L (ref 96–112)
Creatinine, Ser: 0.6 mg/dL (ref 0.40–1.20)
GFR: 112.32 mL/min (ref >60.00)
Glucose, Bld: 163 mg/dL — ABNORMAL HIGH (ref 70–99)
Potassium: 3.8 mEq/L (ref 3.5–5.1)
Sodium: 137 mEq/L (ref 135–145)
Total Bilirubin: 0.5 mg/dL (ref 0.2–1.2)
Total Protein: 7.1 g/dL (ref 6.0–8.3)

## 2022-03-07 LAB — TSH: TSH: 1.78 u[IU]/mL (ref 0.35–5.50)

## 2022-03-07 LAB — LIPID PANEL
Cholesterol: 275 mg/dL — ABNORMAL HIGH (ref 0–200)
HDL: 46.7 mg/dL (ref >39.00)
NonHDL: 228.5
Total CHOL/HDL Ratio: 6
Triglycerides: 243 mg/dL — ABNORMAL HIGH (ref 0.0–149.0)
VLDL: 48.6 mg/dL — ABNORMAL HIGH (ref 0.0–40.0)

## 2022-03-07 LAB — MICROALBUMIN / CREATININE URINE RATIO
Creatinine,U: 35.2 mg/dL
Microalb Creat Ratio: 2 mg/g (ref 0.0–30.0)
Microalb, Ur: <0.7 mg/dL (ref 0.0–1.9)

## 2022-03-07 LAB — LDL CHOLESTEROL, DIRECT: Direct LDL: 194 mg/dL

## 2022-03-07 LAB — HEMOGLOBIN A1C: Hgb A1c MFr Bld: 7.2 % — ABNORMAL HIGH (ref 4.6–6.5)

## 2022-03-07 NOTE — Patient Instructions (Signed)
Welcome!     Today we are screening you for conditions that will influence which type of blood pressure medication we start ? ?I will send you a message after your labs have been reviewed ? ?Referral to cardiology in progress ? ?

## 2022-03-07 NOTE — Assessment & Plan Note (Signed)
Therapy to be initiated following review of renal function,  Presence of proteinuria and other issues ?

## 2022-03-07 NOTE — Assessment & Plan Note (Signed)
She has not begun annual screening. Mammogram ordered  ?

## 2022-03-07 NOTE — Progress Notes (Addendum)
? ?Subjective:  ?Patient ID: Christie Wright, female    DOB: [birth year]  Age: 41 y.o. MRN: [id number] ? ?CC: The primary encounter diagnosis was Other fatigue. Diagnoses of Hyperlipidemia, unspecified hyperlipidemia type, Elevated blood pressure reading, Impaired fasting glucose, Encounter for screening for HIV, Need for hepatitis C screening test, Family history of early CAD, Essential hypertension, Menorrhagia with regular cycle, Family history of breast cancer, Encounter for screening mammogram for malignant neoplasm of breast, Morbid obesity ([place]), Current every day vaping, History of tobacco abuse, and Obesity, diabetes, and hypertension syndrome ([place]) were also pertinent to this visit. ? ?HPI ?Christie Wright presents for establishment of care and management of hypertension . Referred by mother Christie Wright.   ? ? ?Treated in ED on march 27 for HTN and intermittent chest pain after having an episode of pounding heart that occurred at bedtime ? ?Has had several readings of 140/79, or higher since then.  Diastolics have been as high as 105.  on the day of the ER visit she had it checked at work and it was 165/126 accompanied by headache and substernal pressures .  Was sent to Urgent Care and then to ED  ? ?Diet reviewed:  has 5 caffeinated beverages daily:  coffee and   3 cokes ? 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 Enjoys her work, denies increased stress. ?Does not exercise intentionally.  But has a mile walk up a hill to her home  ?Ex smoker, smoked for 4 years  , now vapes  ?Strong FH of CAD  ? ?Obesity:  lost 35 lbs on Optavia diet but stopped I August due to cost.  Has regained all the weight  ? ?Subtotal hysterectomy  :  last PAP 2018  ? ?History ?Christie Wright has no past medical history on file.  ? ?She has a past surgical history that includes Tubal ligation; Cesarean section; Wisdom tooth extraction; Total laparoscopic hysterectomy with salpingectomy (Bilateral, 05/11/2017); Cystoscopy  (05/11/2017); and Abdominal hysterectomy.  ? ?Her family history includes Bone cancer in her paternal grandfather; Breast cancer in her maternal grandmother, paternal grandmother, and another family member; Diabetes in her mother; Heart attack in her father and mother; Hypertension in her mother; Lung cancer in her maternal grandfather; Ovarian cancer in her maternal aunt.She reports that she quit smoking about 18 years ago. Her smoking use included cigarettes. She has a 1.00 pack-year smoking history. She has never used smokeless tobacco. She reports that she does not drink alcohol and does not use drugs. ? ?Outpatient Medications Prior to Visit  ?Medication Sig Dispense Refill  ? loratadine (CLARITIN) 10 MG tablet Take 10 mg by mouth daily.    ? ibuprofen (ADVIL,MOTRIN) 200 MG tablet Take 200 mg by mouth 3 (three) times daily as needed for cramping.     ? ?No facility-administered medications prior to visit.  ? ? ?Review of Systems: ? ?Patient denies headache, fevers, malaise, unintentional weight loss, skin rash, eye pain, sinus congestion and sinus pain, sore throat, dysphagia,  hemoptysis , cough, dyspnea, wheezing, chest pain, palpitations, orthopnea, edema, abdominal pain, nausea, melena, diarrhea, constipation, flank pain, dysuria, hematuria, urinary  Frequency, nocturia, numbness, tingling, seizures,  Focal weakness, Loss of consciousness,  Tremor, insomnia, depression, anxiety, and suicidal ideation.   ? ? ?Objective:  ?BP (!) 140/92 (BP Location: Left Arm, Patient Position: Sitting, Cuff Size: Large)   Pulse 90   Temp 98 ?F (36.7 ?C) (Oral)   Ht 5' (1.524 m)   Wt 207  lb (93.9 kg)   LMP 04/20/2017 (Exact Date)   SpO2 96%   BMI 40.43 kg/m?  ? ?Physical Exam: ? ?General appearance: alert, cooperative and appears stated age ?Ears: normal TM's and external ear canals both ears ?Throat: lips, mucosa, and tongue normal; teeth and gums normal ?Neck: no adenopathy, no carotid bruit, supple, symmetrical,  trachea midline and thyroid not enlarged, symmetric, no tenderness/mass/nodules ?Back: symmetric, no curvature. ROM normal. No CVA tenderness. ?Lungs: clear to auscultation bilaterally ?Heart: regular rate and rhythm, S1, S2 normal, no murmur, click, rub or gallop ?Abdomen: soft, non-tender; bowel sounds normal; no masses,  no organomegaly ?Pulses: 2+ and symmetric ?Skin: Skin color, texture, turgor normal. No rashes or lesions ?Lymph nodes: Cervical, supraclavicular, and axillary nodes normal.  ? ?Assessment & Plan:  ? ?Problem List Items Addressed This Visit   ? ? Obesity, diabetes, and hypertension syndrome ([place])  ?  Diagnosed with a1c of 7.2,  fasting glucose 160.  No proteinuria but wl start losartan 50 mg daily for BP. Will need statin as well,  At follow up visit  ?  ?  ? Relevant Medications  ? losartan (COZAAR) 50 MG tablet  ? Other Relevant Orders  ? Basic metabolic panel  ? Morbid obesity ([place])  ?  BMI is 40 and she has new onset hypertension Previusly successful at losing weight with Optavia diet,  With weight regain after stopping. .  I have addressed  BMI and recommended a low glycemic index diet utilizing smaller more frequent meals to increase metabolism.  I have also recommended that patient start exercising with a goal of 30 minutes of aerobic exercise a minimum of 5 days per week. Screening for lipid disorders, thyroid and diabetes to be done today. Currently Mancel Parsons is not covered by her insurance   ? ? ?  ?  ? Menorrhagia with regular cycle  ?  S/p total lap hysterectomy and BSO ?  ?  ? History of tobacco abuse  ?  She smoked by 4 yrs. ,  Quit and is now vaping.  counselling given  ?  ?  ? Family history of early CAD  ?  Referring to cardiology for noninvasive evaluation given multiple risk factors.  Lipid panel is pending.  Baseline EKG was done during ER visit March 2023 and was reviewed today  ?  ?  ? Relevant Orders  ? Ambulatory referral to Cardiology  ? Family history of breast cancer  ?   She has not begun annual screening. Mammogram ordered  ?  ?  ? Essential hypertension  ?  Therapy to be initiated following review of renal function,  Presence of proteinuria and other issues ?  ?  ? Relevant Medications  ? losartan (COZAAR) 50 MG tablet  ? Current every day vaping  ?  counselling given  ?  ?  ? ?Other Visit Diagnoses   ? ? Other fatigue    -  Primary  ? Relevant Orders  ? TSH (Completed)  ? Hyperlipidemia, unspecified hyperlipidemia type      ? Relevant Medications  ? losartan (COZAAR) 50 MG tablet  ? Other Relevant Orders  ? Lipid Profile (Completed)  ? Elevated blood pressure reading      ? Relevant Orders  ? Comp Met (CMET) (Completed)  ? Urine Microalbumin w/creat. ratio (Completed)  ? Impaired fasting glucose      ? Relevant Orders  ? HgB A1c (Completed)  ? Encounter for screening for HIV      ?  Relevant Orders  ? HIV antibody (with reflex)  ? Need for hepatitis C screening test      ? Relevant Orders  ? Hepatitis C Antibody  ? Encounter for screening mammogram for malignant neoplasm of breast      ? Relevant Orders  ? MM 3D SCREEN BREAST BILATERAL  ? ?  ? ? ?I have discontinued Donald Prose. Wille's ibuprofen. I am also having her start on losartan. Additionally, I am having her maintain her loratadine. ? ?Meds ordered this encounter  ?Medications  ? losartan (COZAAR) 50 MG tablet  ?  Sig: Take 1 tablet (50 mg total) by mouth at bedtime.  ?  Dispense:  30 tablet  ?  Refill:  0  ? ? ?Medications Discontinued During This Encounter  ?Medication Reason  ? ibuprofen (ADVIL,MOTRIN) 200 MG tablet   ? ? ?Follow-up: Return in about 4 weeks (around 04/04/2022). ? ? ?Crecencio Mc, MD ?

## 2022-03-07 NOTE — Assessment & Plan Note (Signed)
BMI is 40 and she has new onset hypertension Previusly successful at losing weight with Optavia diet,  With weight regain after stopping. .  I have addressed  BMI and recommended a low glycemic index diet utilizing smaller more frequent meals to increase metabolism.  I have also recommended that patient start exercising with a goal of 30 minutes of aerobic exercise a minimum of 5 days per week. Screening for lipid disorders, thyroid and diabetes to be done today. Currently Christie Wright is not covered by her insurance   ? ? ?

## 2022-03-07 NOTE — Assessment & Plan Note (Signed)
She smoked by 4 yrs. ,  Quit and is now vaping.  counselling given  ?

## 2022-03-07 NOTE — Assessment & Plan Note (Signed)
Referring to cardiology for noninvasive evaluation given multiple risk factors.  Lipid panel is pending.  Baseline EKG was done during ER visit March 2023 and was reviewed today  ?

## 2022-03-07 NOTE — Assessment & Plan Note (Signed)
S/p total lap hysterectomy and BSO ?

## 2022-03-07 NOTE — Assessment & Plan Note (Signed)
counselling given  ?

## 2022-03-08 DIAGNOSIS — I152 Hypertension secondary to endocrine disorders: Secondary | ICD-10-CM | POA: Insufficient documentation

## 2022-03-08 LAB — HIV ANTIBODY (ROUTINE TESTING W REFLEX): HIV 1&2 Ab, 4th Generation: NONREACTIVE

## 2022-03-08 LAB — HEPATITIS C ANTIBODY
Hepatitis C Ab: NONREACTIVE
SIGNAL TO CUT-OFF: 0.03 (ref <1.00)

## 2022-03-08 MED ORDER — LOSARTAN POTASSIUM 50 MG PO TABS: 50.0000 mg | ORAL_TABLET | Freq: Every day | ORAL | 0 refills | Status: DC

## 2022-03-08 NOTE — Addendum Note (Signed)
Addended by: Crecencio Mc on: 03/08/2022 12:57 PM ? ? Modules accepted: Orders ? ?

## 2022-03-08 NOTE — Assessment & Plan Note (Signed)
Diagnosed with a1c of 7.2,  fasting glucose 160.  No proteinuria but wl start losartan 50 mg daily for BP. Will need statin as well,  At follow up visit  ?

## 2022-03-10 ENCOUNTER — Telehealth: Payer: Self-pay

## 2022-03-10 NOTE — Telephone Encounter (Signed)
error 

## 2022-03-14 ENCOUNTER — Telehealth: Payer: Self-pay | Admitting: Internal Medicine

## 2022-03-14 NOTE — Telephone Encounter (Signed)
Pt called in to reschedule her appt that she had on 03/17/2022 at 11am... Pt is reschedule for 03/29/2022 at 1:30pm... Pt was wondering if its ok to wait that long for follow up... Pt requesting callback ?

## 2022-03-15 NOTE — Telephone Encounter (Signed)
Pt is aware.  

## 2022-03-17 ENCOUNTER — Ambulatory Visit: Payer: BC Managed Care – PPO | Admitting: Internal Medicine

## 2022-03-29 ENCOUNTER — Ambulatory Visit (INDEPENDENT_AMBULATORY_CARE_PROVIDER_SITE_OTHER): Payer: BC Managed Care – PPO | Admitting: Internal Medicine

## 2022-03-29 ENCOUNTER — Encounter: Payer: Self-pay | Admitting: Internal Medicine

## 2022-03-29 VITALS — BP 122/76 | HR 105 | Temp 98.1°F | Ht 60.0 in | Wt 204.2 lb

## 2022-03-29 DIAGNOSIS — E669 Obesity, unspecified: Secondary | ICD-10-CM

## 2022-03-29 DIAGNOSIS — J069 Acute upper respiratory infection, unspecified: Secondary | ICD-10-CM | POA: Diagnosis not present

## 2022-03-29 DIAGNOSIS — I10 Essential (primary) hypertension: Secondary | ICD-10-CM

## 2022-03-29 DIAGNOSIS — E1169 Type 2 diabetes mellitus with other specified complication: Secondary | ICD-10-CM

## 2022-03-29 DIAGNOSIS — E119 Type 2 diabetes mellitus without complications: Secondary | ICD-10-CM

## 2022-03-29 DIAGNOSIS — E1159 Type 2 diabetes mellitus with other circulatory complications: Secondary | ICD-10-CM

## 2022-03-29 DIAGNOSIS — J029 Acute pharyngitis, unspecified: Secondary | ICD-10-CM | POA: Diagnosis not present

## 2022-03-29 MED ORDER — OZEMPIC (0.25 OR 0.5 MG/DOSE) 2 MG/1.5ML ~~LOC~~ SOPN
0.5000 mg | PEN_INJECTOR | SUBCUTANEOUS | 2 refills | Status: DC
Start: 2022-03-29 — End: 2022-04-05

## 2022-03-29 MED ORDER — HYDROCOD POLI-CHLORPHE POLI ER 10-8 MG/5ML PO SUER: 5.0000 mL | Freq: Every evening | ORAL | 0 refills | Status: DC | PRN

## 2022-03-29 NOTE — Assessment & Plan Note (Addendum)
Recommend Starting ozempic  For concurrent obesity .  Foot exam done.  Losartan started for hypertension.  Will repeat labs in 3 months and consider statin  . Standards of care outlined.  ? ?Lab Results  ?Component Value Date  ? HGBA1C 7.2 (H) 03/07/2022  ? ?Lab Results  ?Component Value Date  ? MICROALBUR <0.7 03/07/2022  ? ?Lab Results  ?Component Value Date  ? CHOL 275 (H) 03/07/2022  ? HDL 46.70 03/07/2022  ? LDLDIRECT 194.0 03/07/2022  ? TRIG 243.0 (H) 03/07/2022  ? CHOLHDL 6 03/07/2022  ? ? ? ?

## 2022-03-29 NOTE — Assessment & Plan Note (Signed)
Supportive care given ?

## 2022-03-29 NOTE — Progress Notes (Signed)
? ?Subjective:  ?Patient ID: Christie Wright, female    DOB: 12-31-1980  Age: 41 y.o. MRN: [id number] ? ?CC: The primary encounter diagnosis was Essential hypertension. Diagnoses of Acute pharyngitis, unspecified etiology, Viral URI with cough, Morbid obesity ([place]), and Obesity, diabetes, and hypertension syndrome ([place]) were also pertinent to this visit. ? ? ?This visit occurred during the SARS-CoV-2 public health emergency.  Safety protocols were in place, including screening questions prior to the visit, additional usage of staff PPE, and extensive cleaning of exam room while observing appropriate contact time as indicated for disinfecting solutions.   ? ?HPI ?Christie Wright presents for  ?Chief Complaint  ?Patient presents with  ? Follow-up  ?  Follow up on lab results  ? ? ? ?1) URI SYMPTOMS started on Friday with pharyngitis ,  Sunday started coughing all night  ? ?2) obesity: lost 35 lbs on [employer] but regained it after stopping it due to cost. Trying to mimic with celery,  hardboiled eggs,  healthy choice entree  ? ?3)  left ankle recurrent swelling without pain happened. ? ?4) Type 2 DM:  new diagnosis, with a1c of 7.2 .  Det reviewed and is already low GI.  Not exercising .   ? ?Outpatient Medications Prior to Visit  ?Medication Sig Dispense Refill  ? loratadine (CLARITIN) 10 MG tablet Take 10 mg by mouth daily.    ? losartan (COZAAR) 50 MG tablet Take 1 tablet (50 mg total) by mouth at bedtime. 30 tablet 0  ? ?No facility-administered medications prior to visit.  ? ? ?Review of Systems; ? ?Patient denies headache, fevers, malaise, unintentional weight loss, skin rash, eye pain, sinus congestion and sinus pain, sore throat, dysphagia,  hemoptysis , cough, dyspnea, wheezing, chest pain, palpitations, orthopnea, edema, abdominal pain, nausea, melena, diarrhea, constipation, flank pain, dysuria, hematuria, urinary  Frequency, nocturia, numbness, tingling, seizures,  Focal weakness, Loss of consciousness,   Tremor, insomnia, depression, anxiety, and suicidal ideation.   ? ? ? ?Objective:  ?BP 122/76 (BP Location: Left Arm, Patient Position: Sitting, Cuff Size: Large)   Pulse (!) 105   Temp 98.1 ?F (36.7 ?C) (Oral)   Ht 5' (1.524 m)   Wt 204 lb 3.2 oz (92.6 kg)   LMP 04/20/2017 (Exact Date)   SpO2 96%   BMI 39.88 kg/m?  ? ?BP Readings from Last 3 Encounters:  ?03/29/22 122/76  ?03/07/22 (!) 140/92  ?02/21/22 126/78  ? ? ?Wt Readings from Last 3 Encounters:  ?03/29/22 204 lb 3.2 oz (92.6 kg)  ?03/07/22 207 lb (93.9 kg)  ?02/21/22 202 lb (91.6 kg)  ? ? ?General appearance: alert, cooperative and appears stated age ?Ears: normal TM's and external ear canals both ears ?Throat: lips, mucosa, and tongue normal; teeth and gums normal ?Neck: no adenopathy, no carotid bruit, supple, symmetrical, trachea midline and thyroid not enlarged, symmetric, no tenderness/mass/nodules ?Back: symmetric, no curvature. ROM normal. No CVA tenderness. ?Lungs: clear to auscultation bilaterally ?Heart: regular rate and rhythm, S1, S2 normal, no murmur, click, rub or gallop ?Abdomen: soft, non-tender; bowel sounds normal; no masses,  no organomegaly ?Pulses: 2+ and symmetric ?Skin: Skin color, texture, turgor normal. No rashes or lesions ?Lymph nodes: Cervical, supraclavicular, and axillary nodes normal. ? ?Lab Results  ?Component Value Date  ? HGBA1C 7.2 (H) 03/07/2022  ? ? ?Lab Results  ?Component Value Date  ? CREATININE 0.60 03/07/2022  ? CREATININE 0.64 02/21/2022  ? ? ?Lab Results  ?Component Value Date  ? WBC 7.8  02/21/2022  ? HGB 14.8 02/21/2022  ? HCT 44.8 02/21/2022  ? PLT 287 02/21/2022  ? GLUCOSE 163 (H) 03/07/2022  ? CHOL 275 (H) 03/07/2022  ? TRIG 243.0 (H) 03/07/2022  ? HDL 46.70 03/07/2022  ? LDLDIRECT 194.0 03/07/2022  ? ALT 22 03/07/2022  ? AST 14 03/07/2022  ? NA 137 03/07/2022  ? K 3.8 03/07/2022  ? CL 100 03/07/2022  ? CREATININE 0.60 03/07/2022  ? BUN 7 03/07/2022  ? CO2 28 03/07/2022  ? TSH 1.78 03/07/2022  ? HGBA1C  7.2 (H) 03/07/2022  ? MICROALBUR <0.7 03/07/2022  ? ? ?DG Chest 2 View ? ?Result Date: 02/21/2022 ?CLINICAL DATA:  Chest pain. EXAM: CHEST - 2 VIEW COMPARISON:  None. FINDINGS: The heart size and mediastinal contours are within normal limits. Both lungs are clear. The visualized skeletal structures are unremarkable. IMPRESSION: No active cardiopulmonary disease. Electronically Signed   By: Misty Stanley M.D.   On: 02/21/2022 11:19  ? ? ?Assessment & Plan:  ? ?Problem List Items Addressed This Visit   ? ? Essential hypertension - Primary  ?  At goal with losartan 50 mg daily.  BMET needed  ? ?  ?  ? Relevant Orders  ? Basic metabolic panel  ? Morbid obesity ([place])  ?  With diabetes and hypertension   Ozempic prescribed. Needs RN visit for instruction on gflucometer use and ozempic .  Needs Prevnar 20 injection at nurse visit  ? ?  ?  ? Relevant Medications  ? Semaglutide,0.25 or 0.'5MG'$ /DOS, (OZEMPIC, 0.25 OR 0.5 MG/DOSE,) 2 MG/1.5ML SOPN  ? Obesity, diabetes, and hypertension syndrome ([place])  ?  Recommend Starting ozempic  For concurrent obesity .  Foot exam done.  Losartan started for hypertension.  Will repeat labs in 3 months and consider statin  ? ?Lab Results  ?Component Value Date  ? HGBA1C 7.2 (H) 03/07/2022  ? ?Lab Results  ?Component Value Date  ? MICROALBUR <0.7 03/07/2022  ? ?Lab Results  ?Component Value Date  ? CHOL 275 (H) 03/07/2022  ? HDL 46.70 03/07/2022  ? LDLDIRECT 194.0 03/07/2022  ? TRIG 243.0 (H) 03/07/2022  ? CHOLHDL 6 03/07/2022  ? ? ? ?  ?  ? Relevant Medications  ? Semaglutide,0.25 or 0.'5MG'$ /DOS, (OZEMPIC, 0.25 OR 0.5 MG/DOSE,) 2 MG/1.5ML SOPN  ? Viral URI with cough  ?  Supportive care given ? ?  ?  ? ?Other Visit Diagnoses   ? ? Acute pharyngitis, unspecified etiology      ? Relevant Orders  ? CBC with Differential/Platelet  ? ?  ? ? ?I spent a total of 28  minutes with this patient in a face to face visit on the date of this encounter reviewing  her most recent labs,  her  diet and eating  habits, her previous weight loss attempts ,  last office visit with me in this visit  in      and post visit ordering of testing and therapeutics.   ? ?Follow-up: Return in about 3 months (around 06/29/2022) for follow up diabetes. ? ? ?Crecencio Mc, MD ?

## 2022-03-29 NOTE — Patient Instructions (Addendum)
Increase claritin to every 12 hours ? ?Use afrin for up to  days twice daily as needed for congestion  ? ?Use dayquil for the daytime cough and tussionex for the nighttime cough ([facility]) ? ?Remember :   Tussionex cough syrup  Because it contains hydrocodone in it,  It may cause constipation and sedation as side effects  So make sure  you have a laxative on hand   ? ?Rx for Ozempic and for glucometer [facility] ..  bring the ozempic and glucometer with you for your RN visit  ?

## 2022-03-29 NOTE — Assessment & Plan Note (Signed)
At goal with losartan 50 mg daily.  BMET needed  ?

## 2022-03-29 NOTE — Assessment & Plan Note (Addendum)
With diabetes and hypertension   Ozempic prescribed. Needs RN visit for instruction on gflucometer use and ozempic .  Needs Prevnar 20 injection at nurse visit  ?

## 2022-03-30 LAB — CBC WITH DIFFERENTIAL/PLATELET
Basophils Absolute: 0.2 10*3/uL — ABNORMAL HIGH (ref 0.0–0.1)
Basophils Relative: 2 % (ref 0.0–3.0)
Eosinophils Absolute: 0.4 10*3/uL (ref 0.0–0.7)
Eosinophils Relative: 4.3 % (ref 0.0–5.0)
HCT: 40.7 % (ref 36.0–46.0)
Hemoglobin: 13.9 g/dL (ref 12.0–15.0)
Lymphocytes Relative: 23.6 % (ref 12.0–46.0)
Lymphs Abs: 2.2 10*3/uL (ref 0.7–4.0)
MCHC: 34.2 g/dL (ref 30.0–36.0)
MCV: 91.8 fl (ref 78.0–100.0)
Monocytes Absolute: 0.6 10*3/uL (ref 0.1–1.0)
Monocytes Relative: 6.2 % (ref 3.0–12.0)
Neutro Abs: 6 10*3/uL (ref 1.4–7.7)
Neutrophils Relative %: 63.9 % (ref 43.0–77.0)
Platelets: 290 10*3/uL (ref 150.0–400.0)
RBC: 4.44 Mil/uL (ref 3.87–5.11)
RDW: 12.6 % (ref 11.5–15.5)
WBC: 9.4 10*3/uL (ref 4.0–10.5)

## 2022-03-30 LAB — BASIC METABOLIC PANEL
BUN: 8 mg/dL (ref 6–23)
CO2: 27 mEq/L (ref 19–32)
Calcium: 9.4 mg/dL (ref 8.4–10.5)
Chloride: 99 mEq/L (ref 96–112)
Creatinine, Ser: 0.71 mg/dL (ref 0.40–1.20)
GFR: 106.35 mL/min (ref >60.00)
Glucose, Bld: 164 mg/dL — ABNORMAL HIGH (ref 70–99)
Potassium: 3.6 mEq/L (ref 3.5–5.1)
Sodium: 137 mEq/L (ref 135–145)

## 2022-03-31 ENCOUNTER — Other Ambulatory Visit: Payer: Self-pay | Admitting: Internal Medicine

## 2022-04-04 ENCOUNTER — Ambulatory Visit: Payer: BC Managed Care – PPO | Admitting: Internal Medicine

## 2022-04-05 ENCOUNTER — Ambulatory Visit: Payer: BC Managed Care – PPO

## 2022-04-05 ENCOUNTER — Other Ambulatory Visit: Payer: Self-pay | Admitting: Internal Medicine

## 2022-04-05 ENCOUNTER — Telehealth: Payer: Self-pay | Admitting: Internal Medicine

## 2022-04-05 ENCOUNTER — Ambulatory Visit: Payer: BC Managed Care – PPO | Admitting: Internal Medicine

## 2022-04-05 MED ORDER — METFORMIN HCL ER 500 MG PO TB24: 500.0000 mg | ORAL_TABLET | Freq: Every day | ORAL | 1 refills | Status: DC

## 2022-04-05 NOTE — Telephone Encounter (Signed)
Pt called stating she does not have the meter for her appt today and pt cannot afford ozempic medication ?

## 2022-04-06 ENCOUNTER — Other Ambulatory Visit: Payer: Self-pay | Admitting: Internal Medicine

## 2022-04-06 DIAGNOSIS — E669 Obesity, unspecified: Secondary | ICD-10-CM

## 2022-04-06 MED ORDER — BLOOD GLUCOSE METER KIT: PACK | 0 refills | Status: AC

## 2022-04-06 NOTE — Addendum Note (Signed)
Addended by: Adair Laundry on: 04/06/2022 04:37 PM ? ? Modules accepted: Orders ? ?

## 2022-04-06 NOTE — Telephone Encounter (Signed)
Pt is aware of medication change and the glucometer has been sent to pharmacy.  ?

## 2022-04-11 NOTE — Progress Notes (Signed)
Cardiology Office Note ? ?Date:  04/12/2022  ? ?ID:  Christie Wright, DOB 08-12-1981, MRN [id number] ? ?PCP:  Crecencio Mc, MD  ? ?Chief Complaint  ?Patient presents with  ? New Patient (Initial Visit)  ?  Ref by Dr. Derrel Nip for family Hx of early CAD. Patient c/o shortness of breath at times, LE edema, palpitations/pounding in chest. Medications reviewed by the patient verbally.   ? ? ?HPI:  ?Christie Wright is a 41 year old woman with past medical history of ?Hypertension ?Morbid obesity ?Diabetes A1c 7.2 ?Former smoker: 3-4 years, still vaps ?Presenting by referral from Dr. Derrel Nip for consultation of her family history of coronary disease ? ?In the emergency room March 2023 with hypertension, chest pressure described as someone pushing on her chest with 1 finger  ?Blood pressure trended down without intervention, no medication changes made ? ?Lab work reviewed ?Total cholesterol 275 LDL 194 ? ?Recently started on Ozempic has not been able to pick this up ?Losartan 50 mg daily for blood pressure, blood pressure numbers much improved ? ?No cardiac imaging available ? ?Family hx: high blood pressure, Dm, lipids ?Mom with CAD with stent ?Dad with CAD/MI, died, age 32 yo ? ?EKG personally reviewed by myself on todays visit ?Normal sinus rhythm rate 92 bpm no significant ST or T wave changes ? ?PMH:   has no past medical history on file. ? ?PSH:    ?Past Surgical History:  ?Procedure Laterality Date  ? ABDOMINAL HYSTERECTOMY    ? CESAREAN SECTION    ? X2  ? CYSTOSCOPY  05/11/2017  ? Procedure: CYSTOSCOPY;  Surgeon: Gae Dry, MD;  Location: [facility] ORS;  Service: Gynecology;;  ? TOTAL LAPAROSCOPIC HYSTERECTOMY WITH SALPINGECTOMY Bilateral 05/11/2017  ? Procedure: HYSTERECTOMY TOTAL LAPAROSCOPIC WITH SALPINGECTOMY;  Surgeon: Gae Dry, MD;  Location: [facility] ORS;  Service: Gynecology;  Laterality: Bilateral;  ? TUBAL LIGATION    ? WISDOM TOOTH EXTRACTION    ? ? ?Current Outpatient Medications  ?Medication  Sig Dispense Refill  ? blood glucose meter kit and supplies Dispense based on patient and insurance preference. Use to check blood sugars up to four times daily as directed. (FOR ICD-10 E11.9). 1 each 0  ? chlorpheniramine-HYDROcodone (TUSSIONEX PENNKINETIC ER) 10-8 MG/5ML Take 5 mLs by mouth at bedtime as needed. 140 mL 0  ? loratadine (CLARITIN) 10 MG tablet Take 10 mg by mouth daily.    ? losartan (COZAAR) 50 MG tablet TAKE 1 TABLET BY MOUTH EVERYDAY AT BEDTIME 30 tablet 0  ? metFORMIN (GLUCOPHAGE-XR) 500 MG 24 hr tablet Take 1 tablet (500 mg total) by mouth daily with breakfast. 90 tablet 1  ? rosuvastatin (CRESTOR) 10 MG tablet Take 1 tablet (10 mg total) by mouth daily. 90 tablet 3  ? ?No current facility-administered medications for this visit.  ? ? ?Allergies:   Patient has no known allergies.  ? ?Social History:  The patient  reports that she quit smoking about 18 years ago. Her smoking use included cigarettes. She has a 1.00 pack-year smoking history. She has never used smokeless tobacco. She reports that she does not drink alcohol and does not use drugs.  ? ?Family History:   family history includes Bone cancer in her paternal grandfather; Breast cancer in her maternal grandmother, paternal grandmother, and another family member; Diabetes in her mother; Heart attack (age of onset: 22) in her mother; Heart attack (age of onset: 59) in her father; Heart disease in her mother; Hypertension in her mother;  Lung cancer in her maternal grandfather; Ovarian cancer in her maternal aunt.  ? ? ?Review of Systems: ?Review of Systems  ?Constitutional: Negative.   ?HENT: Negative.    ?Respiratory: Negative.    ?Cardiovascular: Negative.   ?Gastrointestinal: Negative.   ?Musculoskeletal: Negative.   ?Neurological: Negative.   ?Psychiatric/Behavioral: Negative.    ?All other systems reviewed and are negative. ? ? ?PHYSICAL EXAM: ?VS:  BP 120/80 (BP Location: Right Arm, Patient Position: Sitting, Cuff Size: Normal)    Pulse 92   Ht 5' (1.524 m)   Wt 206 lb 6 oz (93.6 kg)   LMP 04/20/2017 (Exact Date)   SpO2 98%   BMI 40.30 kg/m?  , BMI Body mass index is 40.3 kg/m?. ?GEN: Well nourished, well developed, in no acute distress ?HEENT: normal ?Neck: no JVD, carotid bruits, or masses ?Cardiac: RRR; no murmurs, rubs, or gallops,no edema  ?Respiratory:  clear to auscultation bilaterally, normal work of breathing ?GI: soft, nontender, nondistended, + BS ?MS: no deformity or atrophy ?Skin: warm and dry, no rash ?Neuro:  Strength and sensation are intact ?Psych: euthymic mood, full affect ? ?Recent Labs: ?03/07/2022: ALT 22; TSH 1.78 ?03/29/2022: BUN 8; Creatinine, Ser 0.71; Hemoglobin 13.9; Platelets 290.0; Potassium 3.6; Sodium 137  ? ? ?Lipid Panel ?Lab Results  ?Component Value Date  ? CHOL 275 (H) 03/07/2022  ? HDL 46.70 03/07/2022  ? TRIG 243.0 (H) 03/07/2022  ? ?  ? ?Wt Readings from Last 3 Encounters:  ?04/12/22 206 lb 6 oz (93.6 kg)  ?03/29/22 204 lb 3.2 oz (92.6 kg)  ?03/07/22 207 lb (93.9 kg)  ?  ? ?ASSESSMENT AND PLAN: ? ?Problem List Items Addressed This Visit   ? ?  ? Cardiology Problems  ? Essential hypertension - Primary  ? Relevant Medications  ? rosuvastatin (CRESTOR) 10 MG tablet  ? Other Relevant Orders  ? EKG 12-Lead  ? Obesity, diabetes, and hypertension syndrome ([place])  ? Relevant Medications  ? rosuvastatin (CRESTOR) 10 MG tablet  ? Other Relevant Orders  ? EKG 12-Lead  ?  ? Other  ? Family history of early CAD  ? Relevant Orders  ? EKG 12-Lead  ? CT CARDIAC SCORING (SELF PAY ONLY)  ? ?Chest pressure ?Feeling better after better blood pressure control ?Discussed various screening strategies ?Given strong family history coronary disease recommended CT coronary calcium scoring ?For any worsening symptoms could order cardiac CTA ?Recommended regular exercise program ? ?Diabetes type 2 ?Difficulty obtaining the Ozempic through her insurance, recommend she talk with primary care or call her insurance company ?Low  carbohydrate diet and exercise program recommended ? ?Hyperlipidemia ?Stressed importance of aggressive cholesterol control in the setting of diabetes and strong family history ?We will start Crestor 10 mg daily ?Ideally goal LDL less than 100, currently 194 ? ?Morbid obesity ?Stressed importance of low carbohydrate diet, walking program ?Medications such as Ozempic helped considerably ? ?Essential hypertension ?Blood pressure is well controlled on today's visit. No changes made to the medications. ? ? ? Total encounter time more than 60 minutes ? Greater than 50% was spent in counseling and coordination of care with the patient ? ?Patient seen in consultation for Dr. Derrel Nip will be referred back to her office for ongoing care of the issues detailed above ? ?Signed, ?Esmond Plants, M.D., Ph.D. ?[facility] [place], [place] ?[phone] ?

## 2022-04-12 ENCOUNTER — Ambulatory Visit (INDEPENDENT_AMBULATORY_CARE_PROVIDER_SITE_OTHER): Payer: BC Managed Care – PPO | Admitting: Cardiovascular Disease

## 2022-04-12 ENCOUNTER — Encounter: Payer: Self-pay | Admitting: Cardiovascular Disease

## 2022-04-12 VITALS — BP 120/80 | HR 92 | Ht 60.0 in | Wt 206.4 lb

## 2022-04-12 DIAGNOSIS — Z8249 Family history of ischemic heart disease and other diseases of the circulatory system: Secondary | ICD-10-CM

## 2022-04-12 DIAGNOSIS — I10 Essential (primary) hypertension: Secondary | ICD-10-CM

## 2022-04-12 DIAGNOSIS — E1169 Type 2 diabetes mellitus with other specified complication: Secondary | ICD-10-CM | POA: Diagnosis not present

## 2022-04-12 DIAGNOSIS — E1159 Type 2 diabetes mellitus with other circulatory complications: Secondary | ICD-10-CM

## 2022-04-12 DIAGNOSIS — E669 Obesity, unspecified: Secondary | ICD-10-CM

## 2022-04-12 DIAGNOSIS — I152 Hypertension secondary to endocrine disorders: Secondary | ICD-10-CM

## 2022-04-12 MED ORDER — ROSUVASTATIN CALCIUM 10 MG PO TABS: 10.0000 mg | ORAL_TABLET | Freq: Every day | ORAL | 3 refills | Status: DC

## 2022-04-12 NOTE — Patient Instructions (Addendum)
Medication Instructions:  ?Please start crestor 10 mg daily ? ?If you need a refill on your cardiac medications before your next appointment, please call your pharmacy.  ? ?Lab work: ?No new labs needed ? ?Testing/Procedures: ? ?We will order CT coronary calcium score ? ?$99 at our [facility] in [place] ? ?Please call Colletta [place] at [phone] to schedule ? ? ?[place] ?[address] ?[place], [place] [address]  ? ?Follow-Up: ?At [facility], you and your health needs are our priority.  As part of our continuing mission to provide you with exceptional heart care, we have created designated Provider Care Teams.  These Care Teams include your primary Cardiologist (physician) and Advanced Practice Providers (APPs -  Physician Assistants and Nurse Practitioners) who all work together to provide you with the care you need, when you need it. ? ?You will need a follow up appointment as needed ? ?Providers on your designated Care Team:   ?Murray Hodgkins, NP ?Christell Faith, PA-C ?Cadence Kathlen Mody, PA-C ? ?COVID-19 Vaccine Information can be found at: ShippingScam.co.uk For questions related to vaccine distribution or appointments, please email [email or web address] or call [phone].  ? ?

## 2022-05-02 ENCOUNTER — Other Ambulatory Visit: Payer: Self-pay | Admitting: Internal Medicine

## 2022-05-02 DIAGNOSIS — I152 Hypertension secondary to endocrine disorders: Secondary | ICD-10-CM

## 2022-05-21 ENCOUNTER — Other Ambulatory Visit: Payer: Self-pay | Admitting: Internal Medicine

## 2022-05-30 ENCOUNTER — Encounter: Payer: Self-pay | Admitting: Primary Care

## 2022-05-30 ENCOUNTER — Ambulatory Visit
Admission: RE | Admit: 2022-05-30 | Discharge: 2022-05-30 | Disposition: A | Discharge status: Home / Self Care | Payer: BC Managed Care – PPO | Source: Ambulatory Visit | Attending: Primary Care | Admitting: Primary Care

## 2022-05-30 ENCOUNTER — Other Ambulatory Visit: Payer: Self-pay | Admitting: Primary Care

## 2022-05-30 ENCOUNTER — Ambulatory Visit (INDEPENDENT_AMBULATORY_CARE_PROVIDER_SITE_OTHER): Payer: BC Managed Care – PPO | Admitting: Primary Care

## 2022-05-30 ENCOUNTER — Telehealth: Payer: Self-pay | Admitting: Internal Medicine

## 2022-05-30 VITALS — BP 122/74 | HR 103 | Temp 98.6°F | Ht 60.0 in | Wt 203.0 lb

## 2022-05-30 DIAGNOSIS — R1030 Lower abdominal pain, unspecified: Secondary | ICD-10-CM | POA: Insufficient documentation

## 2022-05-30 DIAGNOSIS — R109 Unspecified abdominal pain: Secondary | ICD-10-CM | POA: Diagnosis not present

## 2022-05-30 DIAGNOSIS — K5792 Diverticulitis of intestine, part unspecified, without perforation or abscess without bleeding: Secondary | ICD-10-CM

## 2022-05-30 HISTORY — DX: Essential (primary) hypertension: I10

## 2022-05-30 HISTORY — DX: Diverticulitis of intestine, part unspecified, without perforation or abscess without bleeding: K57.92

## 2022-05-30 HISTORY — DX: Type 2 diabetes mellitus without complications: E11.9

## 2022-05-30 LAB — CBC WITH DIFFERENTIAL/PLATELET
Basophils Absolute: 0.1 10*3/uL (ref 0.0–0.1)
Basophils Relative: 1.3 % (ref 0.0–3.0)
Eosinophils Absolute: 0.1 10*3/uL (ref 0.0–0.7)
Eosinophils Relative: 1.1 % (ref 0.0–5.0)
HCT: 37.4 % (ref 36.0–46.0)
Hemoglobin: 12.7 g/dL (ref 12.0–15.0)
Lymphocytes Relative: 19.4 % (ref 12.0–46.0)
Lymphs Abs: 2 10*3/uL (ref 0.7–4.0)
MCHC: 34.1 g/dL (ref 30.0–36.0)
MCV: 91.8 fl (ref 78.0–100.0)
Monocytes Absolute: 0.6 10*3/uL (ref 0.1–1.0)
Monocytes Relative: 6.1 % (ref 3.0–12.0)
Neutro Abs: 7.6 10*3/uL (ref 1.4–7.7)
Neutrophils Relative %: 72.1 % (ref 43.0–77.0)
Platelets: 220 10*3/uL (ref 150.0–400.0)
RBC: 4.07 Mil/uL (ref 3.87–5.11)
RDW: 12.2 % (ref 11.5–15.5)
WBC: 10.5 10*3/uL (ref 4.0–10.5)

## 2022-05-30 LAB — POC URINALSYSI DIPSTICK (AUTOMATED)
Bilirubin, UA: NEGATIVE
Blood, UA: NEGATIVE
Glucose, UA: NEGATIVE
Ketones, UA: NEGATIVE
Leukocytes, UA: NEGATIVE
Nitrite, UA: NEGATIVE
Protein, UA: POSITIVE — AB
Spec Grav, UA: 1.02 (ref 1.010–1.025)
Urobilinogen, UA: 0.2 E.U./dL
pH, UA: 6 (ref 5.0–8.0)

## 2022-05-30 LAB — COMPREHENSIVE METABOLIC PANEL
ALT: 13 U/L (ref 0–35)
AST: 10 U/L (ref 0–37)
Albumin: 4.2 g/dL (ref 3.5–5.2)
Alkaline Phosphatase: 99 U/L (ref 39–117)
BUN: 6 mg/dL (ref 6–23)
CO2: 26 mEq/L (ref 19–32)
Calcium: 8.8 mg/dL (ref 8.4–10.5)
Chloride: 101 mEq/L (ref 96–112)
Creatinine, Ser: 0.56 mg/dL (ref 0.40–1.20)
GFR: 114.02 mL/min (ref >60.00)
Glucose, Bld: 130 mg/dL — ABNORMAL HIGH (ref 70–99)
Potassium: 3.7 mEq/L (ref 3.5–5.1)
Sodium: 137 mEq/L (ref 135–145)
Total Bilirubin: 0.5 mg/dL (ref 0.2–1.2)
Total Protein: 6.8 g/dL (ref 6.0–8.3)

## 2022-05-30 LAB — POCT I-STAT CREATININE: Creatinine, Ser: 0.5 mg/dL (ref 0.44–1.00)

## 2022-05-30 MED ORDER — IOHEXOL 300 MG/ML  SOLN
100.0000 mL | Freq: Once | INTRAMUSCULAR | Status: AC | PRN
  Administered 2022-05-30: 100 mL via INTRAVENOUS

## 2022-05-30 MED ORDER — AMOXICILLIN-POT CLAVULANATE 875-125 MG PO TABS: 1.0000 | ORAL_TABLET | Freq: Two times a day (BID) | ORAL | 0 refills | Status: DC

## 2022-05-30 NOTE — Telephone Encounter (Signed)
Access nurse called back and stated pt need to be seen in two to three hours. I told nurse that we did not have any appointments in our office but we did at other locations. Pt was scheduled with Alma Friendly today at 12pm

## 2022-05-30 NOTE — Assessment & Plan Note (Signed)
Suspicious for acute abdominal process, especially given her presentation today. Differentials include diverticulitis, abdominal abscess, acute appendicitis, ovarian cysts.   UA today negative.  Checking CBC and CMP.  CT abdomen/pelvis ordered stat today.  Strict ED precautions provided.  Await results.

## 2022-05-30 NOTE — Progress Notes (Signed)
Subjective:    Patient ID: Christie Wright, female    DOB: [birth year], 40 y.o.   MRN: [id number]  Abdominal Pain Associated symptoms include constipation. Pertinent negatives include no diarrhea, dysuria, fever, frequency, hematuria, nausea or vomiting.    Christie SHAMBAUGH is a very pleasant 41 y.o. female patient of Dr. Derrel Nip with a history of hypertension, type 2 diabetes, obesity who presents today to discuss abdominal pain.   Symptom onset three days ago that is located to the mid lower abdomen in between umbilicus and upper pelvic region. She describes her pain as constant and dull, intermittently sharp, occasional radiation to bilateral lower abdomen. Her pain is worse with movement, riding in her car on a bumpy road, getting in and out of a chair, essential with any movement.    She was at the beach last week, completed more strenuous activity than usual so she thought that she pulled a muscle. She drinks little water in general, did increase consumption of water while at the beach.   She's also noticed more firm stools. Straining to have a bowel movement cause pain. She stared metformin and rosuvastatin 1 month ago for diabetes and hyperlipidemia, no issues since starting.   She denies hematuria, foul smelling urine, vaginal discharge, flank pain. She is in a monogamous relationship with her husband, she has no concerns for STD's. She underwent partial hysterectomy in 2018, ovaries remain. She has no known history of diverticulosis, but she does have a family history of acute diverticulitis in her mother.    Review of Systems  Constitutional:  Negative for fever.  Gastrointestinal:  Positive for abdominal pain and constipation. Negative for diarrhea, nausea and vomiting.  Genitourinary:  Negative for dysuria, frequency, hematuria, urgency and vaginal discharge.         History reviewed. No pertinent past medical history.  Social History   Socioeconomic History   Marital  status: Married    Spouse name: Not on file   Number of children: 3   Years of education: Not on file   Highest education level: Not on file  Occupational History   Not on file  Tobacco Use   Smoking status: Former    Packs/day: 0.50    Years: 2.00    Total pack years: 1.00    Types: Cigarettes    Quit date: 05/05/2003    Years since quitting: 19.0   Smokeless tobacco: Never  Vaping Use   Vaping Use: Some days  Substance and Sexual Activity   Alcohol use: No   Drug use: No   Sexual activity: Yes    Birth control/protection: Surgical  Other Topics Concern   Not on file  Social History Narrative   Not on file   Social Determinants of Health   Financial Resource Strain: Not on file  Food Insecurity: Not on file  Transportation Needs: Not on file  Physical Activity: Not on file  Stress: Not on file  Social Connections: Not on file  Intimate Partner Violence: Not on file    Past Surgical History:  Procedure Laterality Date   ABDOMINAL HYSTERECTOMY     CESAREAN SECTION     X2   CYSTOSCOPY  05/11/2017   Procedure: CYSTOSCOPY;  Surgeon: Gae Dry, MD;  Location: [facility] ORS;  Service: Gynecology;;   TOTAL LAPAROSCOPIC HYSTERECTOMY WITH SALPINGECTOMY Bilateral 05/11/2017   Procedure: HYSTERECTOMY TOTAL LAPAROSCOPIC WITH SALPINGECTOMY;  Surgeon: Gae Dry, MD;  Location: [facility] ORS;  Service: Gynecology;  Laterality: Bilateral;  TUBAL LIGATION     WISDOM TOOTH EXTRACTION      Family History  Problem Relation Age of Onset   Heart disease Mother    Diabetes Mother    Hypertension Mother    Heart attack Mother 57   Heart attack Father 25       passed away from heart attack   Ovarian cancer Maternal Aunt    Breast cancer Maternal Grandmother    Lung cancer Maternal Grandfather    Breast cancer Paternal Grandmother    Bone cancer Paternal Grandfather    Breast cancer Other     No Known Allergies  Current Outpatient Medications on File Prior to Visit   Medication Sig Dispense Refill   blood glucose meter kit and supplies Dispense based on patient and insurance preference. Use to check blood sugars up to four times daily as directed. (FOR ICD-10 E11.9). 1 each 0   loratadine (CLARITIN) 10 MG tablet Take 10 mg by mouth daily.     losartan (COZAAR) 50 MG tablet TAKE 1 TABLET BY MOUTH EVERYDAY AT BEDTIME 90 tablet 1   metFORMIN (GLUCOPHAGE-XR) 500 MG 24 hr tablet Take 1 tablet (500 mg total) by mouth daily with breakfast. 90 tablet 1   ONETOUCH VERIO test strip USE TO CHECK BLOOD SUGAR UP TO FOUR TIMES A DAY 100 strip 6   rosuvastatin (CRESTOR) 10 MG tablet Take 1 tablet (10 mg total) by mouth daily. 90 tablet 3   No current facility-administered medications on file prior to visit.    BP 122/74   Pulse (!) 103   Temp 98.6 F (37 C) (Oral)   Ht 5' (1.524 m)   Wt 203 lb (92.1 kg)   LMP 04/20/2017 (Exact Date)   SpO2 96%   BMI 39.65 kg/m  Objective:   Physical Exam Constitutional:      Comments: Appears uncomfortable with movement and with palpation   Cardiovascular:     Rate and Rhythm: Normal rate and regular rhythm.  Pulmonary:     Effort: Pulmonary effort is normal.     Breath sounds: Normal breath sounds.  Abdominal:     General: Bowel sounds are normal.     Palpations: Abdomen is soft.     Tenderness: There is abdominal tenderness in the right lower quadrant, periumbilical area, suprapubic area and left lower quadrant.     Hernia: There is no hernia in the umbilical area.  Musculoskeletal:     Cervical back: Neck supple.  Skin:    General: Skin is warm and dry.           Assessment & Plan:   Problem List Items Addressed This Visit       Other   Lower abdominal pain - Primary    Suspicious for acute abdominal process, especially given her presentation today. Differentials include diverticulitis, abdominal abscess, acute appendicitis, ovarian cysts.   UA today negative.  Checking CBC and CMP.  CT  abdomen/pelvis ordered stat today.  Strict ED precautions provided.  Await results.       Relevant Orders   CBC with Differential/Platelet   Comprehensive metabolic panel   POCT Urinalysis Dipstick (Automated)   CT ABDOMEN PELVIS W CONTRAST       Pleas Koch, NP

## 2022-05-30 NOTE — Telephone Encounter (Signed)
Pt called stating she has intense pain below her belly button and it started mild on saturday and now its getting worse sent to access nurse

## 2022-05-30 NOTE — Patient Instructions (Signed)
Stop by the lab prior to leaving today. I will notify you of your results once received.   You will be contacted regarding your CT scan.  Please let us know if you have not been contacted within 2 hours.   Do not eat or drink anything else.  It was a pleasure meeting you!

## 2022-05-30 NOTE — Telephone Encounter (Signed)
Pt has been scheduled for today at 12 pm with NP Alma Friendly.

## 2022-06-01 ENCOUNTER — Telehealth: Payer: Self-pay | Admitting: Internal Medicine

## 2022-06-01 NOTE — Telephone Encounter (Signed)
Pt sent mychart message to get an appt for 06/03/2022... Mychart message stated that pt went to see Alma Friendly and was diagnosis with acute diverticulitis after getting a CT... Pt stated in mychart note that provider Alma Friendly requested pt to get a follow up appointment with provider by 06/03/2022.... No available appt until aug.Marland KitchenMarland Kitchen

## 2022-06-02 NOTE — Telephone Encounter (Signed)
Pt is scheduled with Anda Kraft 7/7.

## 2022-06-03 ENCOUNTER — Encounter: Payer: Self-pay | Admitting: Primary Care

## 2022-06-03 ENCOUNTER — Ambulatory Visit (INDEPENDENT_AMBULATORY_CARE_PROVIDER_SITE_OTHER): Payer: BC Managed Care – PPO | Admitting: Primary Care

## 2022-06-03 DIAGNOSIS — K5792 Diverticulitis of intestine, part unspecified, without perforation or abscess without bleeding: Secondary | ICD-10-CM

## 2022-06-03 NOTE — Patient Instructions (Addendum)
Continue the antibiotics as prescribed.   Continue a bland diet for now.   Please follow up if your symptoms return.  It was a pleasure meeting you!  Diverticulitis  Diverticulitis is infection or inflammation of small pouches (diverticula) in the colon that form due to a condition called diverticulosis. Diverticula can trap stool (feces) and bacteria, causing infection and inflammation. Diverticulitis may cause severe stomach pain and diarrhea. It may lead to tissue damage in the colon that causes bleeding or blockage. The diverticula may also burst (rupture) and cause infected stool to enter other areas of the abdomen. What are the causes? This condition is caused by stool becoming trapped in the diverticula, which allows bacteria to grow in the diverticula. This leads to inflammation and infection. What increases the risk? You are more likely to develop this condition if you have diverticulosis. The risk increases if you: Are overweight or obese. Do not get enough exercise. Drink alcohol. Use tobacco products. Eat a diet that has a lot of red meat such as beef, pork, or lamb. Eat a diet that does not include enough fiber. High-fiber foods include fruits, vegetables, beans, nuts, and whole grains. Are over 60 years of age. What are the signs or symptoms? Symptoms of this condition may include: Pain and tenderness in the abdomen. The pain is normally located on the left side of the abdomen, but it may occur in other areas. Fever and chills. Nausea. Vomiting. Cramping. Bloating. Changes in bowel routines. Blood in your stool. How is this diagnosed? This condition is diagnosed based on: Your medical history. A physical exam. Tests to make sure there is nothing else causing your condition. These tests may include: Blood tests. Urine tests. CT scan of the abdomen. How is this treated? Most cases of this condition are mild and can be treated at home. Treatment may  include: Taking over-the-counter pain medicines. Following a clear liquid diet. Taking antibiotic medicines by mouth. Resting. More severe cases may need to be treated at a hospital. Treatment may include: Not eating or drinking. Taking prescription pain medicine. Receiving antibiotic medicines through an IV. Receiving fluids and nutrition through an IV. Surgery. When your condition is under control, your health care provider may recommend that you have a colonoscopy. This is an exam to look at the entire large intestine. During the exam, a lubricated, bendable tube is inserted into the anus and then passed into the rectum, colon, and other parts of the large intestine. A colonoscopy can show how severe your diverticula are and whether something else may be causing your symptoms. Follow these instructions at home: Medicines Take over-the-counter and prescription medicines only as told by your health care provider. These include fiber supplements, probiotics, and stool softeners. If you were prescribed an antibiotic medicine, take it as told by your health care provider. Do not stop taking the antibiotic even if you start to feel better. Ask your health care provider if the medicine prescribed to you requires you to avoid driving or using machinery. Eating and drinking  Follow a full liquid diet or another diet as directed by your health care provider. After your symptoms improve, your health care provider may tell you to change your diet. He or she may recommend that you eat a diet that contains at least 25 grams (25 g) of fiber daily. Fiber makes it easier to pass stool. Healthy sources of fiber include: Berries. One cup contains 4-8 grams of fiber. Beans or lentils. One-half cup contains 5-8  grams of fiber. Green vegetables. One cup contains 4 grams of fiber. Avoid eating red meat. General instructions Do not use any products that contain nicotine or tobacco, such as cigarettes,  e-cigarettes, and chewing tobacco. If you need help quitting, ask your health care provider. Exercise for at least 30 minutes, 3 times each week. You should exercise hard enough to raise your heart rate and break a sweat. Keep all follow-up visits as told by your health care provider. This is important. You may need to have a colonoscopy. Contact a health care provider if: Your pain does not improve. Your bowel movements do not return to normal. Get help right away if: Your pain gets worse. Your symptoms do not get better with treatment. Your symptoms suddenly get worse. You have a fever. You vomit more than one time. You have stools that are bloody, black, or tarry. Summary Diverticulitis is infection or inflammation of small pouches (diverticula) in the colon that form due to a condition called diverticulosis. Diverticula can trap stool (feces) and bacteria, causing infection and inflammation. You are at higher risk for this condition if you have diverticulosis and you eat a diet that does not include enough fiber. Most cases of this condition are mild and can be treated at home. More severe cases may need to be treated at a hospital. When your condition is under control, your health care provider may recommend that you have an exam called a colonoscopy. This exam can show how severe your diverticula are and whether something else may be causing your symptoms. Keep all follow-up visits as told by your health care provider. This is important. This information is not intended to replace advice given to you by your health care provider. Make sure you discuss any questions you have with your health care provider. Document Revised: 08/26/2019 Document Reviewed: 08/26/2019 Elsevier Patient Education  [employer].

## 2022-06-03 NOTE — Assessment & Plan Note (Signed)
Diagnosed on CT scan. Improving and appears much better today!  Fortunately, labs were negative for evidence of systemic infection, AKI, other complications. Exam today mostly negative.  Continue Augmentin twice daily until complete. We discussed to continue with a bland diet until she completes antibiotics.  She will see PCP next month as scheduled.  She will follow-up sooner if symptoms return.

## 2022-06-03 NOTE — Progress Notes (Signed)
Subjective:    Patient ID: Christie Wright, female    DOB: 07/02/1981, 41 y.o.   MRN: [id number]  HPI  Christie Wright is a very pleasant 41 y.o. female patient of Dr. Derrel Nip with a history of hypertension, obesity who presents today for follow-up of diverticulitis.  She was last evaluated on 05/30/2022 by me for a 3-day history of acute mid abdominal and upper pelvic pain. She had no prior history of diverticulitis and no known history of diverticulosis. During exam she was moderately tender on light palpation and appeared uncomfortable so she was sent for labs and stat CT abdomen/pelvis.    Her CT scan revealed "acute sigmoid colonic diverticulitis, small amount of extraluminal free air with adjacent fat stranding concerning for early developing abscess", otherwise unremarkable.  Her labs were negative for acute leukocytosis, or any other abnormality. She was treated in the outpatient setting with Augmentin antibiotics twice daily x10 days and told to start a clear liquid diet.  She is here for follow-up today.  Since her last visit she's feeling much better!  Her symptoms have nearly resolved and she is feeling 95% better. She has mild tenderness remaining to the left upper pelvic region. She denies fevers, pain with movement, pain with rest, diarrhea, constipation. She went on a clear liquid diet for two days. She is currently eating a bland diet now.   She does not recall anything different about her diet prior to symptom onset except for eating a very spicy salsa 1 to 2 days prior.  She does enjoy popcorn, but she has not eaten popcorn in 1 month.  Review of Systems  Constitutional:  Negative for fever.  Gastrointestinal:  Negative for abdominal pain, constipation, diarrhea, nausea and vomiting.         Past Medical History:  Diagnosis Date   Diabetes mellitus without complication ([place])    Hypertension     Social History   Socioeconomic History   Marital status: Married     Spouse name: Not on file   Number of children: 3   Years of education: Not on file   Highest education level: Not on file  Occupational History   Not on file  Tobacco Use   Smoking status: Former    Packs/day: 0.50    Years: 2.00    Total pack years: 1.00    Types: Cigarettes    Quit date: 05/05/2003    Years since quitting: 19.0   Smokeless tobacco: Never  Vaping Use   Vaping Use: Some days  Substance and Sexual Activity   Alcohol use: No   Drug use: No   Sexual activity: Yes    Birth control/protection: Surgical  Other Topics Concern   Not on file  Social History Narrative   Not on file   Social Determinants of Health   Financial Resource Strain: Not on file  Food Insecurity: Not on file  Transportation Needs: Not on file  Physical Activity: Not on file  Stress: Not on file  Social Connections: Not on file  Intimate Partner Violence: Not on file    Past Surgical History:  Procedure Laterality Date   ABDOMINAL HYSTERECTOMY     CESAREAN SECTION     X2   CYSTOSCOPY  05/11/2017   Procedure: CYSTOSCOPY;  Surgeon: Gae Dry, MD;  Location: [facility] ORS;  Service: Gynecology;;   TOTAL LAPAROSCOPIC HYSTERECTOMY WITH SALPINGECTOMY Bilateral 05/11/2017   Procedure: HYSTERECTOMY TOTAL LAPAROSCOPIC WITH SALPINGECTOMY;  Surgeon: Gae Dry,  MD;  Location: [facility] ORS;  Service: Gynecology;  Laterality: Bilateral;   TUBAL LIGATION     WISDOM TOOTH EXTRACTION      Family History  Problem Relation Age of Onset   Heart disease Mother    Diabetes Mother    Hypertension Mother    Heart attack Mother 66   Heart attack Father 30       passed away from heart attack   Ovarian cancer Maternal Aunt    Breast cancer Maternal Grandmother    Lung cancer Maternal Grandfather    Breast cancer Paternal Grandmother    Bone cancer Paternal Grandfather    Breast cancer Other     No Known Allergies  Current Outpatient Medications on File Prior to Visit  Medication Sig  Dispense Refill   amoxicillin-clavulanate (AUGMENTIN) 875-125 MG tablet Take 1 tablet by mouth 2 (two) times daily. 20 tablet 0   blood glucose meter kit and supplies Dispense based on patient and insurance preference. Use to check blood sugars up to four times daily as directed. (FOR ICD-10 E11.9). 1 each 0   loratadine (CLARITIN) 10 MG tablet Take 10 mg by mouth daily.     losartan (COZAAR) 50 MG tablet TAKE 1 TABLET BY MOUTH EVERYDAY AT BEDTIME 90 tablet 1   metFORMIN (GLUCOPHAGE-XR) 500 MG 24 hr tablet Take 1 tablet (500 mg total) by mouth daily with breakfast. 90 tablet 1   ONETOUCH VERIO test strip USE TO CHECK BLOOD SUGAR UP TO FOUR TIMES A DAY 100 strip 6   rosuvastatin (CRESTOR) 10 MG tablet Take 1 tablet (10 mg total) by mouth daily. 90 tablet 3   No current facility-administered medications on file prior to visit.    BP 120/68   Pulse 68   Temp 98.6 F (37 C) (Oral)   Ht 5' (1.524 m)   Wt 200 lb (90.7 kg)   LMP 04/20/2017 (Exact Date)   SpO2 96%   BMI 39.06 kg/m  Objective:   Physical Exam Constitutional:      General: She is not in acute distress.    Appearance: She is not ill-appearing.  Abdominal:     General: Bowel sounds are normal.     Palpations: Abdomen is soft.     Tenderness: There is abdominal tenderness in the suprapubic area. There is no guarding.       Comments: Mild abdominal tenderness to mid upper pelvic region  Neurological:     Mental Status: She is alert.           Assessment & Plan:   Problem List Items Addressed This Visit       Digestive   Acute diverticulitis    Diagnosed on CT scan. Improving and appears much better today!  Fortunately, labs were negative for evidence of systemic infection, AKI, other complications. Exam today mostly negative.  Continue Augmentin twice daily until complete. We discussed to continue with a bland diet until she completes antibiotics.  She will see PCP next month as scheduled.  She will  follow-up sooner if symptoms return.          Pleas Koch, NP

## 2022-06-08 ENCOUNTER — Ambulatory Visit: Payer: BC Managed Care – PPO | Admitting: Internal Medicine

## 2022-06-28 ENCOUNTER — Encounter: Payer: Self-pay | Admitting: Internal Medicine

## 2022-06-29 ENCOUNTER — Encounter: Payer: Self-pay | Admitting: Internal Medicine

## 2022-06-29 ENCOUNTER — Ambulatory Visit (INDEPENDENT_AMBULATORY_CARE_PROVIDER_SITE_OTHER): Payer: BC Managed Care – PPO | Admitting: Internal Medicine

## 2022-06-29 VITALS — BP 122/78 | HR 83 | Temp 97.8°F | Resp 14 | Ht 60.0 in | Wt 201.0 lb

## 2022-06-29 DIAGNOSIS — I10 Essential (primary) hypertension: Secondary | ICD-10-CM | POA: Diagnosis not present

## 2022-06-29 DIAGNOSIS — E1159 Type 2 diabetes mellitus with other circulatory complications: Secondary | ICD-10-CM

## 2022-06-29 DIAGNOSIS — E785 Hyperlipidemia, unspecified: Secondary | ICD-10-CM

## 2022-06-29 DIAGNOSIS — I152 Hypertension secondary to endocrine disorders: Secondary | ICD-10-CM | POA: Diagnosis not present

## 2022-06-29 DIAGNOSIS — R5383 Other fatigue: Secondary | ICD-10-CM

## 2022-06-29 DIAGNOSIS — Z23 Encounter for immunization: Secondary | ICD-10-CM

## 2022-06-29 DIAGNOSIS — E1169 Type 2 diabetes mellitus with other specified complication: Secondary | ICD-10-CM

## 2022-06-29 DIAGNOSIS — E538 Deficiency of other specified B group vitamins: Secondary | ICD-10-CM

## 2022-06-29 DIAGNOSIS — E669 Obesity, unspecified: Secondary | ICD-10-CM

## 2022-06-29 DIAGNOSIS — Z8249 Family history of ischemic heart disease and other diseases of the circulatory system: Secondary | ICD-10-CM

## 2022-06-29 LAB — LIPID PANEL
Cholesterol: 179 mg/dL (ref 0–200)
HDL: 37.3 mg/dL — ABNORMAL LOW (ref >39.00)
LDL Cholesterol: 107 mg/dL — ABNORMAL HIGH (ref 0–99)
NonHDL: 142.03
Total CHOL/HDL Ratio: 5
Triglycerides: 175 mg/dL — ABNORMAL HIGH (ref 0.0–149.0)
VLDL: 35 mg/dL (ref 0.0–40.0)

## 2022-06-29 LAB — COMPREHENSIVE METABOLIC PANEL
ALT: 23 U/L (ref 0–35)
AST: 18 U/L (ref 0–37)
Albumin: 4.5 g/dL (ref 3.5–5.2)
Alkaline Phosphatase: 98 U/L (ref 39–117)
BUN: 8 mg/dL (ref 6–23)
CO2: 26 mEq/L (ref 19–32)
Calcium: 9.3 mg/dL (ref 8.4–10.5)
Chloride: 102 mEq/L (ref 96–112)
Creatinine, Ser: 0.73 mg/dL (ref 0.40–1.20)
GFR: 102.68 mL/min (ref >60.00)
Glucose, Bld: 142 mg/dL — ABNORMAL HIGH (ref 70–99)
Potassium: 3.8 mEq/L (ref 3.5–5.1)
Sodium: 138 mEq/L (ref 135–145)
Total Bilirubin: 0.4 mg/dL (ref 0.2–1.2)
Total Protein: 7.2 g/dL (ref 6.0–8.3)

## 2022-06-29 LAB — HEMOGLOBIN A1C: Hgb A1c MFr Bld: 7.3 % — ABNORMAL HIGH (ref 4.6–6.5)

## 2022-06-29 LAB — B12 AND FOLATE PANEL
Folate: 16.7 ng/mL (ref >5.9)
Vitamin B-12: 220 pg/mL (ref 211–911)

## 2022-06-29 LAB — LDL CHOLESTEROL, DIRECT: Direct LDL: 103 mg/dL

## 2022-06-29 MED ORDER — TIRZEPATIDE 2.5 MG/0.5ML ~~LOC~~ SOAJ: 2.5000 mg | SUBCUTANEOUS | 2 refills | Status: DC

## 2022-06-29 NOTE — Assessment & Plan Note (Signed)
Well controlled on current regimen of losartan  Renal function stable, no changes today.

## 2022-06-29 NOTE — Assessment & Plan Note (Signed)
With diabetes and hypertension .  Previous weight oss of 30 lbs with Optavia diet.  Now interested in starting   [facility], which today has been prescribed and she was given the voucher for  Free 30 day supply.  She has received  instruction on use. Marland Kitchen

## 2022-06-29 NOTE — Assessment & Plan Note (Signed)
She is tolerating high potency statin started by Dr Rockey Situ

## 2022-06-29 NOTE — Progress Notes (Signed)
Subjective:  Patient ID: Christie Wright, female    DOB: 16-Dec-1980  Age: 41 y.o. MRN: [id number]  CC: The primary encounter diagnosis was Obesity, diabetes, and hypertension syndrome ([place]). Diagnoses of Hyperlipidemia, unspecified hyperlipidemia type, Other fatigue, Need for Tdap vaccination, Need for pneumococcal 20-valent conjugate vaccination, Essential hypertension, Family history of early CAD, and Morbid obesity ([place]) were also pertinent to this visit.   HPI Christie Wright presents for  Chief Complaint  Patient presents with   Follow-up    3 month follow up on diabetes, will call to sch eye exam.    1) obesity diabetes and hypertension:  she has been maintaining her weight but not able to lose since stopping the optavia diet.  Ozempic was prescribed but not covered by insurance (none of the GLP 1 agonists are).  Forgets to eat ,  some days will eat only one meal because of her hectic work schedule.   Prevnar 20 and Tdap needed and to be given today.  Tolerating metformin, no changes to stool consistency.  Has not had b12 checked   2) Familial hyperlipidemia :  crestor 10 mg started in May by Dr Rockey Situ.  LFTS were normal in early July    3) s/p acute diverticulitis  occurred on the last day of beach vacation,  suprapubic pain and LLQ pain gradually worsened.  CT done which was concerning for developing abscess, but infection resolved. With augmentin . No prior episodes,  no constipation.  Stools are chronically on the soft side.  Eats peanuts popcorn and tomatoes without any problem.   Eating only once per day some days,  gets too busy to eat. Teacher /principal   step 5  already buys    Outpatient Medications Prior to Visit  Medication Sig Dispense Refill   blood glucose meter kit and supplies Dispense based on patient and insurance preference. Use to check blood sugars up to four times daily as directed. (FOR ICD-10 E11.9). 1 each 0   loratadine (CLARITIN) 10 MG tablet Take  10 mg by mouth daily.     losartan (COZAAR) 50 MG tablet TAKE 1 TABLET BY MOUTH EVERYDAY AT BEDTIME 90 tablet 1   metFORMIN (GLUCOPHAGE-XR) 500 MG 24 hr tablet Take 1 tablet (500 mg total) by mouth daily with breakfast. 90 tablet 1   ONETOUCH VERIO test strip USE TO CHECK BLOOD SUGAR UP TO FOUR TIMES A DAY 100 strip 6   rosuvastatin (CRESTOR) 10 MG tablet Take 1 tablet (10 mg total) by mouth daily. 90 tablet 3   amoxicillin-clavulanate (AUGMENTIN) 875-125 MG tablet Take 1 tablet by mouth 2 (two) times daily. (Patient not taking: Reported on 06/29/2022) 20 tablet 0   No facility-administered medications prior to visit.    Review of Systems;  Patient denies headache, fevers, malaise, unintentional weight loss, skin rash, eye pain, sinus congestion and sinus pain, sore throat, dysphagia,  hemoptysis , cough, dyspnea, wheezing, chest pain, palpitations, orthopnea, edema, abdominal pain, nausea, melena, diarrhea, constipation, flank pain, dysuria, hematuria, urinary  Frequency, nocturia, numbness, tingling, seizures,  Focal weakness, Loss of consciousness,  Tremor, insomnia, depression, anxiety, and suicidal ideation.      Objective:  BP 122/78 (BP Location: Left Arm, Patient Position: Sitting, Cuff Size: Large)   Pulse 83   Temp 97.8 F (36.6 C) (Oral)   Resp 14   Ht 5' (1.524 m)   Wt 201 lb (91.2 kg)   LMP 04/20/2017 (Exact Date)   SpO2 97%  BMI 39.26 kg/m   BP Readings from Last 3 Encounters:  06/29/22 122/78  06/03/22 120/68  05/30/22 122/74    Wt Readings from Last 3 Encounters:  06/29/22 201 lb (91.2 kg)  06/03/22 200 lb (90.7 kg)  05/30/22 203 lb (92.1 kg)    General appearance: alert, cooperative and appears stated age Ears: normal TM's and external ear canals both ears Throat: lips, mucosa, and tongue normal; teeth and gums normal Neck: no adenopathy, no carotid bruit, supple, symmetrical, trachea midline and thyroid not enlarged, symmetric, no  tenderness/mass/nodules Back: symmetric, no curvature. ROM normal. No CVA tenderness. Lungs: clear to auscultation bilaterally Heart: regular rate and rhythm, S1, S2 normal, no murmur, click, rub or gallop Abdomen: soft, non-tender; bowel sounds normal; no masses,  no organomegaly Pulses: 2+ and symmetric Skin: Skin color, texture, turgor normal. No rashes or lesions Lymph nodes: Cervical, supraclavicular, and axillary nodes normal.  Lab Results  Component Value Date   HGBA1C 7.2 (H) 03/07/2022    Lab Results  Component Value Date   CREATININE 0.50 05/30/2022   CREATININE 0.56 05/30/2022   CREATININE 0.71 03/29/2022    Lab Results  Component Value Date   WBC 10.5 05/30/2022   HGB 12.7 05/30/2022   HCT 37.4 05/30/2022   PLT 220.0 05/30/2022   GLUCOSE 130 (H) 05/30/2022   CHOL 275 (H) 03/07/2022   TRIG 243.0 (H) 03/07/2022   HDL 46.70 03/07/2022   LDLDIRECT 194.0 03/07/2022   ALT 13 05/30/2022   AST 10 05/30/2022   NA 137 05/30/2022   K 3.7 05/30/2022   CL 101 05/30/2022   CREATININE 0.50 05/30/2022   BUN 6 05/30/2022   CO2 26 05/30/2022   TSH 1.78 03/07/2022   HGBA1C 7.2 (H) 03/07/2022   MICROALBUR <0.7 03/07/2022    CT ABDOMEN PELVIS W CONTRAST  Result Date: 05/30/2022 CLINICAL DATA:  Acute abdominal pain. EXAM: CT ABDOMEN AND PELVIS WITH CONTRAST TECHNIQUE: Multidetector CT imaging of the abdomen and pelvis was performed using the standard protocol following bolus administration of intravenous contrast. RADIATION DOSE REDUCTION: This exam was performed according to the departmental dose-optimization program which includes automated exposure control, adjustment of the mA and/or kV according to patient size and/or use of iterative reconstruction technique. CONTRAST:  169m OMNIPAQUE IOHEXOL 300 MG/ML  SOLN COMPARISON:  None Available. FINDINGS: Lower chest: No acute abnormality. Hepatobiliary: No focal liver abnormality is seen. No gallstones, gallbladder wall thickening,  or biliary dilatation. Pancreas: Unremarkable. No pancreatic ductal dilatation or surrounding inflammatory changes. Spleen: Normal in size without focal abnormality. Adrenals/Urinary Tract: Adrenal glands are unremarkable. Kidneys are normal, without renal calculi, focal lesion, or hydronephrosis. Bladder is unremarkable. Stomach/Bowel: Stomach is within normal limits. Appendix appears normal. Moderate-to-severe colonic diverticulosis. Focal sigmoid colonic wall thickening with adjacent fat stranding consistent with acute diverticulitis. Fat stranding and small amount of extraluminal free air, (series 2, image 65; series 5, image 66) concerning for early developing abscess. Vascular/Lymphatic: No significant vascular findings are present. No enlarged abdominal or pelvic lymph nodes. Reproductive: Status post hysterectomy. No adnexal masses. Other: No abdominal wall hernia or abnormality. No abdominopelvic ascites. Musculoskeletal: No acute or significant osseous findings. IMPRESSION: 1. Acute sigmoid colonic diverticulitis. Small amount of extraluminal free air with adjacent fat stranding concerning for early developing abscess. No drainable fluid collection at this time. Continued follow-up is recommended. 2. Normal appendix. 3. Evidence of nephrolithiasis or hydronephrosis. Electronically Signed   By: IKeane PoliceD.O.   On: 05/30/2022 16:24  Assessment & Plan:   Problem List Items Addressed This Visit     Essential hypertension    Well controlled on current regimen of losartan  Renal function stable, no changes today.      Family history of early CAD    She is tolerating high potency statin started by Dr Rockey Situ       Morbid obesity [facility])    With diabetes and hypertension .  Previous weight oss of 30 lbs with Optavia diet.  Now interested in starting   [facility], which today has been prescribed and she was given the voucher for  Free 30 day supply.  She has received  instruction on use. .         Relevant Medications   tirzepatide (MOUNJARO) 2.5 MG/0.5ML Pen   Obesity, diabetes, and hypertension syndrome ([place]) - Primary    Recommend Starting Mounjaro  For concurrent obesity .  Foot exam up to date.  Tolerating Losartan s fo hypertension.   repeat labs due , now taking a  statin  . Standards of care outlined.   Lab Results  Component Value Date   HGBA1C 7.2 (H) 03/07/2022   Lab Results  Component Value Date   MICROALBUR <0.7 03/07/2022   Lab Results  Component Value Date   CHOL 275 (H) 03/07/2022   HDL 46.70 03/07/2022   LDLDIRECT 194.0 03/07/2022   TRIG 243.0 (H) 03/07/2022   CHOLHDL 6 03/07/2022          Relevant Medications   tirzepatide (MOUNJARO) 2.5 MG/0.5ML Pen   Other Relevant Orders   HgB A1c   Comp Met (CMET)   Other Visit Diagnoses     Hyperlipidemia, unspecified hyperlipidemia type       Relevant Orders   Lipid Panel w/reflex Direct LDL   Other fatigue       Relevant Orders   B12 and Folate Panel   Need for Tdap vaccination       Relevant Orders   Tdap vaccine greater than or equal to 7yo IM   Need for pneumococcal 20-valent conjugate vaccination       Relevant Orders   Pneumococcal conjugate vaccine 20-valent       I spent a total of  38 minutes with this patient in a face to face visit on the date of this encounter reviewing the last office visit with me,   most recent with patient's cardiologist  Dr Rockey Situ, patient's diet and eating habits, h,  most recent imaging study, and post visit ordering of testing and therapeutics.    Follow-up: No follow-ups on file.   Crecencio Mc, MD

## 2022-06-29 NOTE — Assessment & Plan Note (Signed)
Recommend Starting [facility]  For concurrent obesity .  Foot exam up to date.  Tolerating Losartan s fo hypertension.   repeat labs due , now taking a  statin  . Standards of care outlined.   Lab Results  Component Value Date   HGBA1C 7.2 (H) 03/07/2022   Lab Results  Component Value Date   MICROALBUR <0.7 03/07/2022   Lab Results  Component Value Date   CHOL 275 (H) 03/07/2022   HDL 46.70 03/07/2022   LDLDIRECT 194.0 03/07/2022   TRIG 243.0 (H) 03/07/2022   CHOLHDL 6 03/07/2022

## 2022-06-29 NOTE — Patient Instructions (Signed)
You received the Prevnar 20 and the Tdap vaccines today  Please go on the [facility] website to see if there are any offers that will allow you to continue using it    You might want to try a premixed protein drink called Premier Protein shake in the morning as a meal substitute  .  It is less $$$ and very low sugar.  AND DELICIOUS !  868 cal  30 g protein  1 g sugar 50% calcium needs

## 2022-07-01 NOTE — Addendum Note (Signed)
Addended by: Crecencio Mc on: 07/01/2022 01:12 PM   Modules accepted: Orders

## 2022-07-06 ENCOUNTER — Ambulatory Visit (INDEPENDENT_AMBULATORY_CARE_PROVIDER_SITE_OTHER): Payer: BC Managed Care – PPO

## 2022-07-06 DIAGNOSIS — E538 Deficiency of other specified B group vitamins: Secondary | ICD-10-CM | POA: Diagnosis not present

## 2022-07-06 MED ORDER — CYANOCOBALAMIN 1000 MCG/ML IJ SOLN
1000.0000 ug | Freq: Once | INTRAMUSCULAR | Status: AC
  Administered 2022-07-06: 1000 ug via INTRAMUSCULAR

## 2022-07-06 NOTE — Progress Notes (Signed)
Pt arrived for 1st B12 injection, given in L deltoid. Pt tolerated injection well, showed no signs of distress nor voiced any concerns.  

## 2022-07-13 ENCOUNTER — Ambulatory Visit (INDEPENDENT_AMBULATORY_CARE_PROVIDER_SITE_OTHER): Payer: BC Managed Care – PPO

## 2022-07-13 DIAGNOSIS — E538 Deficiency of other specified B group vitamins: Secondary | ICD-10-CM | POA: Diagnosis not present

## 2022-07-13 MED ORDER — CYANOCOBALAMIN 1000 MCG/ML IJ SOLN
1000.0000 ug | Freq: Once | INTRAMUSCULAR | Status: AC
  Administered 2022-07-13: 1000 ug via INTRAMUSCULAR

## 2022-07-13 NOTE — Progress Notes (Signed)
Patient presented for B 12 injection to left deltoid, patient voiced no concerns nor showed any signs of distress during injection. 

## 2022-07-20 ENCOUNTER — Ambulatory Visit (INDEPENDENT_AMBULATORY_CARE_PROVIDER_SITE_OTHER): Payer: BC Managed Care – PPO

## 2022-07-20 DIAGNOSIS — E538 Deficiency of other specified B group vitamins: Secondary | ICD-10-CM | POA: Diagnosis not present

## 2022-07-20 MED ORDER — CYANOCOBALAMIN 1000 MCG/ML IJ SOLN
1000.0000 ug | Freq: Once | INTRAMUSCULAR | Status: AC
  Administered 2022-07-20: 1000 ug via INTRAMUSCULAR

## 2022-07-20 NOTE — Progress Notes (Signed)
Patient presented for B 12 injection to left deltoid, patient voiced no concerns nor showed any signs of distress during injection. 

## 2022-07-27 ENCOUNTER — Ambulatory Visit (INDEPENDENT_AMBULATORY_CARE_PROVIDER_SITE_OTHER): Payer: BC Managed Care – PPO

## 2022-07-27 DIAGNOSIS — E538 Deficiency of other specified B group vitamins: Secondary | ICD-10-CM

## 2022-07-27 MED ORDER — CYANOCOBALAMIN 1000 MCG/ML IJ SOLN
1000.0000 ug | Freq: Once | INTRAMUSCULAR | Status: AC
  Administered 2022-07-27: 1000 ug via INTRAMUSCULAR

## 2022-07-27 NOTE — Progress Notes (Signed)
Patient presented for B 12 injection to left deltoid, patient voiced no concerns nor showed any signs of distress during injection. 

## 2022-07-31 LAB — INTRINSIC FACTOR ANTIBODIES: Intrinsic Factor: NEGATIVE

## 2022-08-04 ENCOUNTER — Encounter: Payer: Self-pay | Admitting: Internal Medicine

## 2022-08-04 DIAGNOSIS — E538 Deficiency of other specified B group vitamins: Secondary | ICD-10-CM | POA: Insufficient documentation

## 2022-09-16 ENCOUNTER — Ambulatory Visit (INDEPENDENT_AMBULATORY_CARE_PROVIDER_SITE_OTHER): Payer: BC Managed Care – PPO | Admitting: Internal Medicine

## 2022-09-16 ENCOUNTER — Encounter: Payer: Self-pay | Admitting: Internal Medicine

## 2022-09-16 VITALS — BP 110/70 | HR 104 | Temp 98.1°F | Resp 14 | Ht 60.0 in | Wt 188.1 lb

## 2022-09-16 DIAGNOSIS — J020 Streptococcal pharyngitis: Secondary | ICD-10-CM | POA: Diagnosis not present

## 2022-09-16 DIAGNOSIS — R509 Fever, unspecified: Secondary | ICD-10-CM

## 2022-09-16 LAB — POCT RAPID STREP A (OFFICE): Rapid Strep A Screen: NEGATIVE

## 2022-09-16 LAB — POC COVID19 BINAXNOW: SARS Coronavirus 2 Ag: NEGATIVE

## 2022-09-16 LAB — POCT INFLUENZA A/B
Influenza A, POC: NEGATIVE
Influenza B, POC: NEGATIVE

## 2022-09-16 MED ORDER — AMOXICILLIN-POT CLAVULANATE 875-125 MG PO TABS: 1.0000 | ORAL_TABLET | Freq: Two times a day (BID) | ORAL | 0 refills | Status: DC

## 2022-09-16 MED ORDER — MOMETASONE FUROATE 0.1 % EX CREA: TOPICAL_CREAM | CUTANEOUS | 1 refills | Status: AC

## 2022-09-16 MED ORDER — CHERATUSSIN AC 100-10 MG/5ML PO SOLN: 5.0000 mL | Freq: Three times a day (TID) | ORAL | 0 refills | Status: DC | PRN

## 2022-09-16 MED ORDER — PREDNISONE 10 MG PO TABS: ORAL_TABLET | ORAL | 0 refills | Status: DC

## 2022-09-16 NOTE — Progress Notes (Unsigned)
Subjective:  Patient ID: Christie Wright, female    DOB: 1981-04-07  Age: 41 y.o. MRN: [id number]  CC: There were no encounter diagnoses.   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 Denies any covid testing at home   Cough    Producing mucus, going down throat had sore throat Wed which has been getting worse. Denies any sob or wheezing    41 yr old female with  diabetes, obesity and hypertension presents with pharyngitis rhinorrhea and PND . Had fevers to 100.8,  body aches, and cough due to PND.  Sinuses are tender.  No pleurisy , wheezing or dyspnea.  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  She has lost her voice as ewll    Outpatient Medications Prior to Visit  Medication Sig Dispense Refill   blood glucose meter kit and supplies Dispense based on patient and insurance preference. Use to check blood sugars up to four times daily as directed. (FOR ICD-10 E11.9). 1 each 0   losartan (COZAAR) 50 MG tablet TAKE 1 TABLET BY MOUTH EVERYDAY AT BEDTIME 90 tablet 1   ONETOUCH VERIO test strip USE TO CHECK BLOOD SUGAR UP TO FOUR TIMES A DAY 100 strip 6   rosuvastatin (CRESTOR) 10 MG tablet Take 1 tablet (10 mg total) by mouth daily. 90 tablet 3   tirzepatide (MOUNJARO) 2.5 MG/0.5ML Pen Inject 2.5 mg into the skin once a week. 2 mL 2   loratadine (CLARITIN) 10 MG tablet Take 10 mg by mouth daily. (Patient not taking: Reported on 09/16/2022)     metFORMIN (GLUCOPHAGE-XR) 500 MG 24 hr tablet Take 1 tablet (500 mg total) by mouth daily with breakfast. (Patient not taking: Reported on 09/16/2022) 90 tablet 1   No facility-administered medications prior to visit.    Review of Systems;  Patient denies headache, fevers, malaise, unintentional weight loss, skin rash, eye pain,  sinus congestion and sinus pain, sore throat, dysphagia,  hemoptysis , cough, dyspnea, wheezing, chest pain, palpitations, orthopnea, edema, abdominal pain, nausea, melena, diarrhea, constipation, flank pain, dysuria, hematuria, urinary  Frequency, nocturia, numbness, tingling, seizures,  Focal weakness, Loss of consciousness,  Tremor, insomnia, depression, anxiety, and suicidal ideation.      Objective:  BP 110/70 (BP Location: Left Arm, Patient Position: Sitting, Cuff Size: Normal)   Pulse (!) 104   Temp 98.1 F (36.7 C) (Oral)   Resp 14   Ht 5' (1.524 m)   Wt 188 lb 1.9 oz (85.3 kg)   LMP 04/20/2017 (Exact Date)   SpO2 97%   BMI 36.74 kg/m   BP Readings from Last 3 Encounters:  09/16/22 110/70  06/29/22 122/78  06/03/22 120/68    Wt Readings from Last 3 Encounters:  09/16/22 188 lb 1.9 oz (85.3 kg)  06/29/22 201 lb (91.2 kg)  06/03/22 200 lb (90.7 kg)    General appearance: alert, cooperative and appears stated age Ears: normal TM's and external ear canals both ears Throat: lips, mucosa, and tongue normal; teeth and gums normal Neck: no adenopathy, no carotid bruit, supple, symmetrical, trachea midline and thyroid not enlarged, symmetric, no tenderness/mass/nodules Back: symmetric, no curvature. ROM normal. No CVA tenderness. Lungs: clear to auscultation bilaterally Heart: regular rate and rhythm, S1, S2 normal, no  murmur, click, rub or gallop Abdomen: soft, non-tender; bowel sounds normal; no masses,  no organomegaly Pulses: 2+ and symmetric Skin: Skin color, texture, turgor normal. No rashes or lesions Lymph nodes: Cervical, supraclavicular, and axillary nodes normal. Neuro:  awake and interactive with normal mood and affect. Higher cortical functions are normal. Speech is clear without word-finding difficulty or dysarthria. Extraocular movements are intact. Visual fields of both eyes are grossly intact. Sensation to light touch is grossly intact bilaterally of upper and  lower extremities. Motor examination shows 4+/5 symmetric hand grip and upper extremity and 5/5 lower extremity strength. There is no pronation or drift. Gait is non-ataxic   Lab Results  Component Value Date   HGBA1C 7.3 (H) 06/29/2022   HGBA1C 7.2 (H) 03/07/2022    Lab Results  Component Value Date   CREATININE 0.73 06/29/2022   CREATININE 0.50 05/30/2022   CREATININE 0.56 05/30/2022    Lab Results  Component Value Date   WBC 10.5 05/30/2022   HGB 12.7 05/30/2022   HCT 37.4 05/30/2022   PLT 220.0 05/30/2022   GLUCOSE 142 (H) 06/29/2022   CHOL 179 06/29/2022   TRIG 175.0 (H) 06/29/2022   HDL 37.30 (L) 06/29/2022   LDLDIRECT 103.0 06/29/2022   LDLCALC 107 (H) 06/29/2022   ALT 23 06/29/2022   AST 18 06/29/2022   NA 138 06/29/2022   K 3.8 06/29/2022   CL 102 06/29/2022   CREATININE 0.73 06/29/2022   BUN 8 06/29/2022   CO2 26 06/29/2022   TSH 1.78 03/07/2022   HGBA1C 7.3 (H) 06/29/2022   MICROALBUR <0.7 03/07/2022    CT ABDOMEN PELVIS W CONTRAST  Result Date: 05/30/2022 CLINICAL DATA:  Acute abdominal pain. EXAM: CT ABDOMEN AND PELVIS WITH CONTRAST TECHNIQUE: Multidetector CT imaging of the abdomen and pelvis was performed using the standard protocol following bolus administration of intravenous contrast. RADIATION DOSE REDUCTION: This exam was performed according to the departmental dose-optimization program which includes automated exposure control, adjustment of the mA and/or kV according to patient size and/or use of iterative reconstruction technique. CONTRAST:  125mL OMNIPAQUE IOHEXOL 300 MG/ML  SOLN COMPARISON:  None Available. FINDINGS: Lower chest: No acute abnormality. Hepatobiliary: No focal liver abnormality is seen. No gallstones, gallbladder wall thickening, or biliary dilatation. Pancreas: Unremarkable. No pancreatic ductal dilatation or surrounding inflammatory changes. Spleen: Normal in size without focal abnormality. Adrenals/Urinary Tract: Adrenal glands are  unremarkable. Kidneys are normal, without renal calculi, focal lesion, or hydronephrosis. Bladder is unremarkable. Stomach/Bowel: Stomach is within normal limits. Appendix appears normal. Moderate-to-severe colonic diverticulosis. Focal sigmoid colonic wall thickening with adjacent fat stranding consistent with acute diverticulitis. Fat stranding and small amount of extraluminal free air, (series 2, image 65; series 5, image 66) concerning for early developing abscess. Vascular/Lymphatic: No significant vascular findings are present. No enlarged abdominal or pelvic lymph nodes. Reproductive: Status post hysterectomy. No adnexal masses. Other: No abdominal wall hernia or abnormality. No abdominopelvic ascites. Musculoskeletal: No acute or significant osseous findings. IMPRESSION: 1. Acute sigmoid colonic diverticulitis. Small amount of extraluminal free air with adjacent fat stranding concerning for early developing abscess. No drainable fluid collection at this time. Continued follow-up is recommended. 2. Normal appendix. 3. Evidence of nephrolithiasis or hydronephrosis. Electronically Signed   By: Keane Police D.O.   On: 05/30/2022 16:24    Assessment & Plan:   Problem List Items Addressed This Visit   None   I spent a total of   minutes with this patient in a face to face  visit on the date of this encounter reviewing the last office visit with me in       ,  most recent visit with cardiology ,    ,  patient's diet and exercise habits, home blood pressure /blod sugar readings, recent ER visit including labs and imaging studies ,   and post visit ordering of testing and therapeutics.    Follow-up: No follow-ups on file.   Crecencio Mc, MD

## 2022-09-16 NOTE — Patient Instructions (Addendum)
Your COVID ,  FLU AND STREP TESTS WERE NEGATIVE BUT I AM TREATING YOU FOR STREP ANYWAY BECAUSE THE LIKELIHOOD IS HIGH   AUGMENTIN TWICE DAILY FOR 7 DAYS  PREDNISONE TAPER FOR THE INFLAMMATION (TO PREVENT OTITIS )  CHERATUSSIN COUGH SYRUP (HAS CODEINE IN IT)  Mometasone ointment twice daily for the ears.

## 2022-09-17 DIAGNOSIS — J029 Acute pharyngitis, unspecified: Secondary | ICD-10-CM | POA: Insufficient documentation

## 2022-09-17 NOTE — Assessment & Plan Note (Addendum)
Treating for strep given high likelihodd of a false negative POC result.  Augmentin,  cheratussin .  COI=VI and influenza tests are negative

## 2022-09-28 ENCOUNTER — Ambulatory Visit (INDEPENDENT_AMBULATORY_CARE_PROVIDER_SITE_OTHER): Payer: BC Managed Care – PPO | Admitting: Internal Medicine

## 2022-09-28 DIAGNOSIS — E1169 Type 2 diabetes mellitus with other specified complication: Secondary | ICD-10-CM

## 2022-09-28 DIAGNOSIS — E1159 Type 2 diabetes mellitus with other circulatory complications: Secondary | ICD-10-CM

## 2022-09-28 DIAGNOSIS — E669 Obesity, unspecified: Secondary | ICD-10-CM

## 2022-09-28 DIAGNOSIS — I152 Hypertension secondary to endocrine disorders: Secondary | ICD-10-CM

## 2022-09-28 NOTE — Progress Notes (Unsigned)
Patient  was scheduled for 3 month follow up on obesity management with mounjaro prescribed at last visit in August.  She failed to keep scheduled appointment and will be charged a no show fee.

## 2022-10-11 ENCOUNTER — Other Ambulatory Visit: Payer: Self-pay | Admitting: Internal Medicine

## 2022-10-12 ENCOUNTER — Encounter: Payer: Self-pay | Admitting: Internal Medicine

## 2022-10-12 ENCOUNTER — Ambulatory Visit (INDEPENDENT_AMBULATORY_CARE_PROVIDER_SITE_OTHER): Payer: BC Managed Care – PPO | Admitting: Internal Medicine

## 2022-10-12 VITALS — BP 122/70 | HR 100 | Temp 98.0°F | Ht 60.0 in | Wt 186.4 lb

## 2022-10-12 DIAGNOSIS — E1159 Type 2 diabetes mellitus with other circulatory complications: Secondary | ICD-10-CM

## 2022-10-12 DIAGNOSIS — E538 Deficiency of other specified B group vitamins: Secondary | ICD-10-CM

## 2022-10-12 DIAGNOSIS — E1169 Type 2 diabetes mellitus with other specified complication: Secondary | ICD-10-CM | POA: Diagnosis not present

## 2022-10-12 DIAGNOSIS — I152 Hypertension secondary to endocrine disorders: Secondary | ICD-10-CM | POA: Diagnosis not present

## 2022-10-12 DIAGNOSIS — E785 Hyperlipidemia, unspecified: Secondary | ICD-10-CM

## 2022-10-12 DIAGNOSIS — K5790 Diverticulosis of intestine, part unspecified, without perforation or abscess without bleeding: Secondary | ICD-10-CM

## 2022-10-12 DIAGNOSIS — I10 Essential (primary) hypertension: Secondary | ICD-10-CM | POA: Diagnosis not present

## 2022-10-12 DIAGNOSIS — Z23 Encounter for immunization: Secondary | ICD-10-CM

## 2022-10-12 DIAGNOSIS — E669 Obesity, unspecified: Secondary | ICD-10-CM | POA: Diagnosis not present

## 2022-10-12 LAB — COMPREHENSIVE METABOLIC PANEL
ALT: 18 U/L (ref 0–35)
AST: 13 U/L (ref 0–37)
Albumin: 4.5 g/dL (ref 3.5–5.2)
Alkaline Phosphatase: 85 U/L (ref 39–117)
BUN: 9 mg/dL (ref 6–23)
CO2: 29 mEq/L (ref 19–32)
Calcium: 9.4 mg/dL (ref 8.4–10.5)
Chloride: 103 mEq/L (ref 96–112)
Creatinine, Ser: 0.68 mg/dL (ref 0.40–1.20)
GFR: 108.52 mL/min (ref >60.00)
Glucose, Bld: 106 mg/dL — ABNORMAL HIGH (ref 70–99)
Potassium: 3.8 mEq/L (ref 3.5–5.1)
Sodium: 138 mEq/L (ref 135–145)
Total Bilirubin: 0.4 mg/dL (ref 0.2–1.2)
Total Protein: 7 g/dL (ref 6.0–8.3)

## 2022-10-12 LAB — LIPID PANEL
Cholesterol: 142 mg/dL (ref 0–200)
HDL: 35.6 mg/dL — ABNORMAL LOW (ref >39.00)
NonHDL: 106.01
Total CHOL/HDL Ratio: 4
Triglycerides: 258 mg/dL — ABNORMAL HIGH (ref 0.0–149.0)
VLDL: 51.6 mg/dL — ABNORMAL HIGH (ref 0.0–40.0)

## 2022-10-12 LAB — LDL CHOLESTEROL, DIRECT: Direct LDL: 73 mg/dL

## 2022-10-12 LAB — HEMOGLOBIN A1C: Hgb A1c MFr Bld: 6.3 % (ref 4.6–6.5)

## 2022-10-12 LAB — VITAMIN B12: Vitamin B-12: 838 pg/mL (ref 211–911)

## 2022-10-12 MED ORDER — TIRZEPATIDE 5 MG/0.5ML ~~LOC~~ SOAJ: 5.0000 mg | SUBCUTANEOUS | 1 refills | Status: DC

## 2022-10-12 NOTE — Assessment & Plan Note (Signed)
Well controlled on current regimen. Renal function stable, no changes today. 

## 2022-10-12 NOTE — Assessment & Plan Note (Signed)
With diabetes and hypertension .  Previous weight loss of 30 lbs with Optavia diet.  Now iwith a 20 lb weight loss since starting   Mounjaro,, increased dose today to 5 mg weekly

## 2022-10-12 NOTE — Assessment & Plan Note (Signed)
MODERATE TO SEVERE BY JULY 2023 CT done during first episode of diverticulitis. Symptoms resolved.  Reviewed fundamentals of prevention

## 2022-10-12 NOTE — Patient Instructions (Signed)
I have increased the Mounjaro dose to 5 mg weekly.  If you can afford to get 3 months of medication at once,  I recommend doing so because of the medication shortages people have been experiencing   BP is at goal of < 130/80  .  Let me know if home readings start to increase beyond that  Your influenza vaccine brings you up to date!

## 2022-10-12 NOTE — Assessment & Plan Note (Signed)
Managed with  [facility]  For concurrent obesity .  Foot exam done today and normal.  She is tolerating losartaand rosuvastatin.   Eye exam needed; reminder given. Repeat labs are pending.  Mounjoar dose increased to 5 mg weekly   Lab Results  Component Value Date   HGBA1C 7.3 (H) 06/29/2022   Lab Results  Component Value Date   MICROALBUR <0.7 03/07/2022   Lab Results  Component Value Date   CHOL 179 06/29/2022   HDL 37.30 (L) 06/29/2022   LDLCALC 107 (H) 06/29/2022   LDLDIRECT 103.0 06/29/2022   TRIG 175.0 (H) 06/29/2022   CHOLHDL 5 06/29/2022

## 2022-10-12 NOTE — Assessment & Plan Note (Signed)
Rechecking level now that injections has been completed.  She is taking a daily oral supplement.

## 2022-10-12 NOTE — Progress Notes (Signed)
Subjective:  Patient ID: Christie Wright, female    DOB: Mar 28, 1981  Age: 41 y.o. MRN: [id number]  CC: The primary encounter diagnosis was Obesity, diabetes, and hypertension syndrome ([place]). Diagnoses of Hyperlipidemia, unspecified hyperlipidemia type, B12 deficiency, Essential hypertension, Morbid obesity ([place]), and Diverticulosis were also pertinent to this visit.   HPI Christie Wright presents for  Chief Complaint  Patient presents with   Follow-up    Follow up on diabetes   1) Type 2 DM:   She  feels generally well,  But is not  exercising regularly except for walking daily.  Checking  blood sugars less than once daily at variable times, usually only if she feels she may be having a hypoglycemic event. .  BS have been under 130 fasting and < 150 post prandially.  Denies any recent hypoglyemic events.  Taking   medications as directed. Following a carbohydrate modified diet 6 days per week. Denies numbness, burning and tingling of extremities. Appetite is good.   taking Mounjaro 2.5 mg ,  rosuvastatin and losartan.  No longer taking metformin .  Down 20 lbs since August  weight has plateaued for the last month.    2) Hypertension: patient checks blood pressure twice weekly at home.  Readings have been for the most part <130/80 at rest . Patient is following a reduce salt diet most days and is taking losartan  as prescribed .  3)  Diverticulosis:  recent episode in July reviewed .  Diet reviewed.   Outpatient Medications Prior to Visit  Medication Sig Dispense Refill   blood glucose meter kit and supplies Dispense based on patient and insurance preference. Use to check blood sugars up to four times daily as directed. (FOR ICD-10 E11.9). 1 each 0   losartan (COZAAR) 50 MG tablet TAKE 1 TABLET BY MOUTH EVERYDAY AT BEDTIME 90 tablet 1   mometasone (ELOCON) 0.1 % cream Apply tiwce daily to ear canals with q tip for eczema 15 g 1   ONETOUCH VERIO test strip USE TO CHECK BLOOD SUGAR UP TO  FOUR TIMES A DAY 100 strip 6   rosuvastatin (CRESTOR) 10 MG tablet Take 1 tablet (10 mg total) by mouth daily. 90 tablet 3   tirzepatide (MOUNJARO) 2.5 MG/0.5ML Pen INJECT 2.5MG INTO THE SKIN ONCE A WEEK 2 mL 1   loratadine (CLARITIN) 10 MG tablet Take 10 mg by mouth daily. (Patient not taking: Reported on 09/16/2022)     metFORMIN (GLUCOPHAGE-XR) 500 MG 24 hr tablet Take 1 tablet (500 mg total) by mouth daily with breakfast. (Patient not taking: Reported on 09/16/2022) 90 tablet 1   amoxicillin-clavulanate (AUGMENTIN) 875-125 MG tablet Take 1 tablet by mouth 2 (two) times daily. 14 tablet 0   guaiFENesin-codeine (CHERATUSSIN AC) 100-10 MG/5ML syrup Take 5 mLs by mouth 3 (three) times daily as needed for cough. 120 mL 0   predniSONE (DELTASONE) 10 MG tablet 6 tablets on Day 1 , then reduce by 1 tablet daily until gone 21 tablet 0   No facility-administered medications prior to visit.    Review of Systems;  Patient denies headache, fevers, malaise, unintentional weight loss, skin rash, eye pain, sinus congestion and sinus pain, sore throat, dysphagia,  hemoptysis , cough, dyspnea, wheezing, chest pain, palpitations, orthopnea, edema, abdominal pain, nausea, melena, diarrhea, constipation, flank pain, dysuria, hematuria, urinary  Frequency, nocturia, numbness, tingling, seizures,  Focal weakness, Loss of consciousness,  Tremor, insomnia, depression, anxiety, and suicidal ideation.  Objective:  BP 122/70 (BP Location: Left Arm, Patient Position: Sitting, Cuff Size: Large)   Pulse 100   Temp 98 F (36.7 C) (Oral)   Ht 5' (1.524 m)   Wt 186 lb 6.4 oz (84.6 kg)   LMP 04/20/2017 (Exact Date)   SpO2 98%   BMI 36.40 kg/m   BP Readings from Last 3 Encounters:  10/12/22 122/70  09/16/22 110/70  06/29/22 122/78    Wt Readings from Last 3 Encounters:  10/12/22 186 lb 6.4 oz (84.6 kg)  09/16/22 188 lb 1.9 oz (85.3 kg)  06/29/22 201 lb (91.2 kg)    General appearance: alert,  cooperative and appears stated age Ears: normal TM's and external ear canals both ears Throat: lips, mucosa, and tongue normal; teeth and gums normal Neck: no adenopathy, no carotid bruit, supple, symmetrical, trachea midline and thyroid not enlarged, symmetric, no tenderness/mass/nodules Back: symmetric, no curvature. ROM normal. No CVA tenderness. Lungs: clear to auscultation bilaterally Heart: regular rate and rhythm, S1, S2 normal, no murmur, click, rub or gallop Abdomen: soft, non-tender; bowel sounds normal; no masses,  no organomegaly Pulses: 2+ and symmetric Skin: Skin color, texture, turgor normal. No rashes or lesions Lymph nodes: Cervical, supraclavicular, and axillary nodes normal. Neuro:  awake and interactive with normal mood and affect. Higher cortical functions are normal. Speech is clear without word-finding difficulty or dysarthria. Extraocular movements are intact. Visual fields of both eyes are grossly intact. Sensation to light touch is grossly intact bilaterally of upper and lower extremities. Motor examination shows 4+/5 symmetric hand grip and upper extremity and 5/5 lower extremity strength. There is no pronation or drift. Gait is non-ataxic   Lab Results  Component Value Date   HGBA1C 7.3 (H) 06/29/2022   HGBA1C 7.2 (H) 03/07/2022    Lab Results  Component Value Date   CREATININE 0.73 06/29/2022   CREATININE 0.50 05/30/2022   CREATININE 0.56 05/30/2022    Lab Results  Component Value Date   WBC 10.5 05/30/2022   HGB 12.7 05/30/2022   HCT 37.4 05/30/2022   PLT 220.0 05/30/2022   GLUCOSE 142 (H) 06/29/2022   CHOL 179 06/29/2022   TRIG 175.0 (H) 06/29/2022   HDL 37.30 (L) 06/29/2022   LDLDIRECT 103.0 06/29/2022   LDLCALC 107 (H) 06/29/2022   ALT 23 06/29/2022   AST 18 06/29/2022   NA 138 06/29/2022   K 3.8 06/29/2022   CL 102 06/29/2022   CREATININE 0.73 06/29/2022   BUN 8 06/29/2022   CO2 26 06/29/2022   TSH 1.78 03/07/2022   HGBA1C 7.3 (H)  06/29/2022   MICROALBUR <0.7 03/07/2022    CT ABDOMEN PELVIS W CONTRAST  Result Date: 05/30/2022 CLINICAL DATA:  Acute abdominal pain. EXAM: CT ABDOMEN AND PELVIS WITH CONTRAST TECHNIQUE: Multidetector CT imaging of the abdomen and pelvis was performed using the standard protocol following bolus administration of intravenous contrast. RADIATION DOSE REDUCTION: This exam was performed according to the departmental dose-optimization program which includes automated exposure control, adjustment of the mA and/or kV according to patient size and/or use of iterative reconstruction technique. CONTRAST:  174m OMNIPAQUE IOHEXOL 300 MG/ML  SOLN COMPARISON:  None Available. FINDINGS: Lower chest: No acute abnormality. Hepatobiliary: No focal liver abnormality is seen. No gallstones, gallbladder wall thickening, or biliary dilatation. Pancreas: Unremarkable. No pancreatic ductal dilatation or surrounding inflammatory changes. Spleen: Normal in size without focal abnormality. Adrenals/Urinary Tract: Adrenal glands are unremarkable. Kidneys are normal, without renal calculi, focal lesion, or hydronephrosis. Bladder is unremarkable. Stomach/Bowel:  Stomach is within normal limits. Appendix appears normal. Moderate-to-severe colonic diverticulosis. Focal sigmoid colonic wall thickening with adjacent fat stranding consistent with acute diverticulitis. Fat stranding and small amount of extraluminal free air, (series 2, image 65; series 5, image 66) concerning for early developing abscess. Vascular/Lymphatic: No significant vascular findings are present. No enlarged abdominal or pelvic lymph nodes. Reproductive: Status post hysterectomy. No adnexal masses. Other: No abdominal wall hernia or abnormality. No abdominopelvic ascites. Musculoskeletal: No acute or significant osseous findings. IMPRESSION: 1. Acute sigmoid colonic diverticulitis. Small amount of extraluminal free air with adjacent fat stranding concerning for early  developing abscess. No drainable fluid collection at this time. Continued follow-up is recommended. 2. Normal appendix. 3. Evidence of nephrolithiasis or hydronephrosis. Electronically Signed   By: Keane Police D.O.   On: 05/30/2022 16:24    Assessment & Plan:   Problem List Items Addressed This Visit     B12 deficiency    Rechecking level now that injections has been completed.  She is taking a daily oral supplement.       Relevant Orders   Vitamin B12   Diverticulosis    MODERATE TO SEVERE BY JULY 2023 CT done during first episode of diverticulitis. Symptoms resolved.  Reviewed fundamentals of prevention       Essential hypertension    Well controlled on current regimen. Renal function stable, no changes today.       Morbid obesity ([place])    With diabetes and hypertension .  Previous weight loss of 30 lbs with Optavia diet.  Now iwith a 20 lb weight loss since starting   Mounjaro,, increased dose today to 5 mg weekly         Relevant Medications   tirzepatide (MOUNJARO) 5 MG/0.5ML Pen   Obesity, diabetes, and hypertension syndrome ([place]) - Primary    Managed with  Mounjaro  For concurrent obesity .  Foot exam done today and normal.  She is tolerating losartaand rosuvastatin.   Eye exam needed; reminder given. Repeat labs are pending.  Mounjoar dose increased to 5 mg weekly   Lab Results  Component Value Date   HGBA1C 7.3 (H) 06/29/2022   Lab Results  Component Value Date   MICROALBUR <0.7 03/07/2022   Lab Results  Component Value Date   CHOL 179 06/29/2022   HDL 37.30 (L) 06/29/2022   LDLCALC 107 (H) 06/29/2022   LDLDIRECT 103.0 06/29/2022   TRIG 175.0 (H) 06/29/2022   CHOLHDL 5 06/29/2022         Relevant Medications   tirzepatide (MOUNJARO) 5 MG/0.5ML Pen   Other Relevant Orders   Comp Met (CMET)   HgB A1c   Other Visit Diagnoses     Hyperlipidemia, unspecified hyperlipidemia type       Relevant Orders   Lipid panel   Direct LDL          Follow-up:  Return in about 6 months (around 04/12/2023).   Crecencio Mc, MD

## 2022-10-29 ENCOUNTER — Encounter: Payer: Self-pay | Admitting: Internal Medicine

## 2022-11-08 ENCOUNTER — Other Ambulatory Visit: Payer: Self-pay | Admitting: Internal Medicine

## 2022-11-08 ENCOUNTER — Encounter: Payer: Self-pay | Admitting: Nurse Practitioner

## 2022-11-08 ENCOUNTER — Telehealth (INDEPENDENT_AMBULATORY_CARE_PROVIDER_SITE_OTHER): Payer: BC Managed Care – PPO | Admitting: Nurse Practitioner

## 2022-11-08 DIAGNOSIS — J111 Influenza due to unidentified influenza virus with other respiratory manifestations: Secondary | ICD-10-CM | POA: Diagnosis not present

## 2022-11-08 DIAGNOSIS — E1159 Type 2 diabetes mellitus with other circulatory complications: Secondary | ICD-10-CM

## 2022-11-08 MED ORDER — OSELTAMIVIR PHOSPHATE 75 MG PO CAPS: 75.0000 mg | ORAL_CAPSULE | Freq: Two times a day (BID) | ORAL | 0 refills | Status: DC

## 2022-11-08 MED ORDER — FLUTICASONE PROPIONATE 50 MCG/ACT NA SUSP: 2.0000 | Freq: Every day | NASAL | 6 refills | Status: AC

## 2022-11-08 NOTE — Assessment & Plan Note (Signed)
Acute, present to diagnosis based on current symptoms and multiple exposures.  Symptom onset within the last 48 hours so we will treat with Tamiflu as well as rest, Flonase nasal spray as needed, Tylenol/ibuprofen as needed, and regular hydration during the day.  Patient encouraged to stay out of work until she has been fever free without medication use for at least 24 hours.  Patient reports understanding.  Patient told to call office if symptoms persist or worsen.

## 2022-11-08 NOTE — Progress Notes (Signed)
   Established Patient Office Visit  An audio-only tele-health visit was completed today for this patient. I connected with  Christie Wright on 11/08/22 utilizing audio-only technology and verified that I am speaking with the correct person using two identifiers. The patient was located at their home, and I was located at the office of [place] at [facility] during the encounter. I discussed the limitations of evaluation and management by telemedicine. The patient expressed understanding and agreed to proceed.    Subjective   Patient ID: Christie Wright, female    DOB: 1981-05-01  Age: 41 y.o. MRN: [id number]  Chief Complaint  Patient presents with   Cough    Patient arrives a virtual visit for above.  Reports symptoms started yesterday.  Symptoms had quick onset.  Has been exposed to multiple people at her work open positive for flu.  Reports having cough, fever, sore throat, headache, muscle aches.  As well as nasal congestion.  Has taken Tylenol for body aches and fever.  Denies shortness of breath.  Did report getting flu shot for the season.    Review of Systems  Constitutional:  Positive for fever (100.5 @ home).  HENT:  Positive for sore throat ("a sick feeling").        (+) sneezinh, (+) rhinorrhea  Respiratory:  Positive for cough. Negative for shortness of breath.   Musculoskeletal:  Positive for myalgias.  Neurological:  Positive for headaches.      Objective:     LMP 04/20/2017 (Exact Date)    Physical Exam Comprehensive physical exam not completed today as office visit was conducted remotely.  Patient sounded well over phone, she did cough but did not appear to be in acute respiratory distress..  Patient was alert and oriented, and appeared to have appropriate judgment.   No results found for any visits on 11/08/22.    The 10-year ASCVD risk score (Arnett DK, et al., 2019) is: 1.7%    Assessment & Plan:   Problem List Items Addressed This  Visit       Respiratory   Flu - Primary    Acute, present to diagnosis based on current symptoms and multiple exposures.  Symptom onset within the last 48 hours so we will treat with Tamiflu as well as rest, Flonase nasal spray as needed, Tylenol/ibuprofen as needed, and regular hydration during the day.  Patient encouraged to stay out of work until she has been fever free without medication use for at least 24 hours.  Patient reports understanding.  Patient told to call office if symptoms persist or worsen.      Relevant Medications   fluticasone (FLONASE) 50 MCG/ACT nasal spray   oseltamivir (TAMIFLU) 75 MG capsule    No follow-ups on file.  Total time spent telephone was 10 minutes and 5 seconds.   Ailene Ards, NP

## 2023-03-07 ENCOUNTER — Telehealth: Payer: BC Managed Care – PPO | Admitting: Physician Assistant

## 2023-03-07 DIAGNOSIS — B9689 Other specified bacterial agents as the cause of diseases classified elsewhere: Secondary | ICD-10-CM | POA: Diagnosis not present

## 2023-03-07 DIAGNOSIS — R051 Acute cough: Secondary | ICD-10-CM

## 2023-03-07 DIAGNOSIS — J329 Chronic sinusitis, unspecified: Secondary | ICD-10-CM | POA: Diagnosis not present

## 2023-03-07 MED ORDER — AMOXICILLIN-POT CLAVULANATE 875-125 MG PO TABS: 1.0000 | ORAL_TABLET | Freq: Two times a day (BID) | ORAL | 0 refills | Status: DC

## 2023-03-07 MED ORDER — PROMETHAZINE-DM 6.25-15 MG/5ML PO SYRP: 5.0000 mL | ORAL_SOLUTION | Freq: Four times a day (QID) | ORAL | 0 refills | Status: DC | PRN

## 2023-03-07 NOTE — Progress Notes (Signed)

## 2023-04-12 ENCOUNTER — Ambulatory Visit (INDEPENDENT_AMBULATORY_CARE_PROVIDER_SITE_OTHER): Payer: BC Managed Care – PPO | Admitting: Internal Medicine

## 2023-04-12 VITALS — BP 108/76 | HR 115 | Temp 98.1°F | Ht 60.0 in | Wt 167.6 lb

## 2023-04-12 DIAGNOSIS — Z7985 Long-term (current) use of injectable non-insulin antidiabetic drugs: Secondary | ICD-10-CM | POA: Insufficient documentation

## 2023-04-12 DIAGNOSIS — E1169 Type 2 diabetes mellitus with other specified complication: Secondary | ICD-10-CM

## 2023-04-12 DIAGNOSIS — J0101 Acute recurrent maxillary sinusitis: Secondary | ICD-10-CM

## 2023-04-12 DIAGNOSIS — B349 Viral infection, unspecified: Secondary | ICD-10-CM

## 2023-04-12 DIAGNOSIS — E785 Hyperlipidemia, unspecified: Secondary | ICD-10-CM | POA: Diagnosis not present

## 2023-04-12 DIAGNOSIS — R5383 Other fatigue: Secondary | ICD-10-CM | POA: Diagnosis not present

## 2023-04-12 DIAGNOSIS — J029 Acute pharyngitis, unspecified: Secondary | ICD-10-CM | POA: Diagnosis not present

## 2023-04-12 DIAGNOSIS — E1159 Type 2 diabetes mellitus with other circulatory complications: Secondary | ICD-10-CM

## 2023-04-12 DIAGNOSIS — E669 Obesity, unspecified: Secondary | ICD-10-CM

## 2023-04-12 DIAGNOSIS — I152 Hypertension secondary to endocrine disorders: Secondary | ICD-10-CM | POA: Diagnosis not present

## 2023-04-12 DIAGNOSIS — J0100 Acute maxillary sinusitis, unspecified: Secondary | ICD-10-CM | POA: Insufficient documentation

## 2023-04-12 LAB — COMPREHENSIVE METABOLIC PANEL
ALT: 12 U/L (ref 0–35)
AST: 12 U/L (ref 0–37)
Albumin: 4.5 g/dL (ref 3.5–5.2)
Alkaline Phosphatase: 87 U/L (ref 39–117)
BUN: 6 mg/dL (ref 6–23)
CO2: 26 mEq/L (ref 19–32)
Calcium: 9.3 mg/dL (ref 8.4–10.5)
Chloride: 103 mEq/L (ref 96–112)
Creatinine, Ser: 0.66 mg/dL (ref 0.40–1.20)
GFR: 108.93 mL/min (ref >60.00)
Glucose, Bld: 111 mg/dL — ABNORMAL HIGH (ref 70–99)
Potassium: 4 mEq/L (ref 3.5–5.1)
Sodium: 137 mEq/L (ref 135–145)
Total Bilirubin: 0.6 mg/dL (ref 0.2–1.2)
Total Protein: 7.5 g/dL (ref 6.0–8.3)

## 2023-04-12 LAB — CBC WITH DIFFERENTIAL/PLATELET
Basophils Absolute: 0.1 10*3/uL (ref 0.0–0.1)
Basophils Relative: 0.8 % (ref 0.0–3.0)
Eosinophils Absolute: 0.1 10*3/uL (ref 0.0–0.7)
Eosinophils Relative: 0.4 % (ref 0.0–5.0)
HCT: 42.8 % (ref 36.0–46.0)
Hemoglobin: 14.6 g/dL (ref 12.0–15.0)
Lymphocytes Relative: 11.4 % — ABNORMAL LOW (ref 12.0–46.0)
Lymphs Abs: 1.4 10*3/uL (ref 0.7–4.0)
MCHC: 34 g/dL (ref 30.0–36.0)
MCV: 90.6 fl (ref 78.0–100.0)
Monocytes Absolute: 1.1 10*3/uL — ABNORMAL HIGH (ref 0.1–1.0)
Monocytes Relative: 8.6 % (ref 3.0–12.0)
Neutro Abs: 10 10*3/uL — ABNORMAL HIGH (ref 1.4–7.7)
Neutrophils Relative %: 78.8 % — ABNORMAL HIGH (ref 43.0–77.0)
Platelets: 225 10*3/uL (ref 150.0–400.0)
RBC: 4.73 Mil/uL (ref 3.87–5.11)
RDW: 12.9 % (ref 11.5–15.5)
WBC: 12.6 10*3/uL — ABNORMAL HIGH (ref 4.0–10.5)

## 2023-04-12 LAB — POC INFLUENZA A&B (BINAX/QUICKVUE)
Influenza A, POC: NEGATIVE
Influenza B, POC: NEGATIVE

## 2023-04-12 LAB — LIPID PANEL
Cholesterol: 158 mg/dL (ref 0–200)
HDL: 54.1 mg/dL (ref >39.00)
LDL Cholesterol: 79 mg/dL (ref 0–99)
NonHDL: 103.7
Total CHOL/HDL Ratio: 3
Triglycerides: 126 mg/dL (ref 0.0–149.0)
VLDL: 25.2 mg/dL (ref 0.0–40.0)

## 2023-04-12 LAB — POC COVID19 BINAXNOW: SARS Coronavirus 2 Ag: NEGATIVE

## 2023-04-12 LAB — TSH: TSH: 1.73 u[IU]/mL (ref 0.35–5.50)

## 2023-04-12 LAB — LDL CHOLESTEROL, DIRECT: Direct LDL: 76 mg/dL

## 2023-04-12 LAB — HEMOGLOBIN A1C: Hgb A1c MFr Bld: 5.5 % (ref 4.6–6.5)

## 2023-04-12 LAB — POCT RAPID STREP A (OFFICE): Rapid Strep A Screen: NEGATIVE

## 2023-04-12 MED ORDER — LEVOFLOXACIN 500 MG PO TABS: 500.0000 mg | ORAL_TABLET | Freq: Every day | ORAL | 0 refills | Status: AC

## 2023-04-12 MED ORDER — TIRZEPATIDE 5 MG/0.5ML ~~LOC~~ SOAJ
5.0000 mg | SUBCUTANEOUS | 1 refills | Status: DC
Start: 2023-04-12 — End: 2023-04-12

## 2023-04-12 MED ORDER — TIRZEPATIDE 5 MG/0.5ML ~~LOC~~ SOAJ: 5.0000 mg | SUBCUTANEOUS | 1 refills | Status: DC

## 2023-04-12 MED ORDER — LOSARTAN POTASSIUM 50 MG PO TABS: ORAL_TABLET | ORAL | 1 refills | Status: DC

## 2023-04-12 MED ORDER — METFORMIN HCL ER 500 MG PO TB24: 500.0000 mg | ORAL_TABLET | Freq: Every day | ORAL | 1 refills | Status: DC

## 2023-04-12 MED ORDER — PREDNISONE 10 MG PO TABS: ORAL_TABLET | ORAL | 0 refills | Status: DC

## 2023-04-12 NOTE — Addendum Note (Signed)
Addended by: Warden Fillers on: 04/12/2023 10:54 AM   Modules accepted: Orders

## 2023-04-12 NOTE — Assessment & Plan Note (Signed)
Managed with  [facility]  For concurrent obesity .  Foot exam done today and normal.  She is tolerating losartan  and rosuvastatin.   Eye exam needed; reminder given. Repeat labs are pending.  Losing weight on 5 mg mounjaro  . Refills given   Lab Results  Component Value Date   HGBA1C 6.3 10/12/2022   Lab Results  Component Value Date   MICROALBUR <0.7 03/07/2022   Lab Results  Component Value Date   CHOL 142 10/12/2022   HDL 35.60 (L) 10/12/2022   LDLCALC 107 (H) 06/29/2022   LDLDIRECT 73.0 10/12/2022   TRIG 258.0 (H) 10/12/2022   CHOLHDL 4 10/12/2022

## 2023-04-12 NOTE — Patient Instructions (Addendum)
Your strep,  COVID and flu tests were all negative    I am treating you for sinusitis/otitis .   I am prescribing an antibiotic (levofloxacin ) and a prednisone taper  To manage the infection and the inflammation in your ear/sinuses.   Consider adding Afrin  every 12 hours for a few days for congestion   For the daytime cough:  Delsym   For the nighttime cough:    Deslym  PLUS  either doxylamine or dipenhydramine    Please take  (or continue)  a probiotic ( Align, Floraque or Culturelle) while you are on the antibiotic to prevent  the  serious antibiotic associated diarrhea  Called clostridium dificile colitis

## 2023-04-12 NOTE — Progress Notes (Signed)
Subjective:  Patient ID: Christie Wright, female    DOB: [birth year]  Age: 42 y.o. MRN: [id number]  CC: The primary encounter diagnosis was Hyperlipidemia, unspecified hyperlipidemia type. Diagnoses of Obesity, diabetes, and hypertension syndrome (HCC), Other fatigue, Viral syndrome, Pharyngitis, unspecified etiology, Long-term current use of injectable noninsulin antidiabetic medication, and Acute recurrent maxillary sinusitis were also pertinent to this visit.   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  She denies fevers ,  but  did yardwork all weekend ,  lots of sneezing .  Some chills,  no dyspnea or wheezing .  Had an URI 1 month ago treated virtually with  augmentin .  New ear pain on the left,  not severe, more like pressure, but left maxillary sinus is also tender    2) follow u pon obesity/diabetes/hypertension:  taking mounjaro 5 mg weekly, losartan and metformin    She is walking  several times per week and checking blood sugars once daily at variable times.  BS have been under 130 fasting and < 150 post prandially.  Denies any recent hypoglyemic events.  Taking his medications as directed. Following a carbohydrate modified diet 6 days per week. Denies numbness, burning and tingling of extremities. Appetite is good.      Outpatient Medications Prior to Visit  Medication Sig Dispense Refill   blood glucose meter kit and supplies Dispense based on patient and insurance preference. Use to check blood sugars up to four times daily as directed. (FOR ICD-10 E11.9). 1 each 0   fluticasone (FLONASE) 50 MCG/ACT nasal spray Place 2 sprays into both nostrils daily. 16 g 6   loratadine (CLARITIN) 10 MG tablet Take 10 mg by mouth daily.     mometasone (ELOCON) 0.1 % cream Apply tiwce daily to  ear canals with q tip for eczema 15 g 1   ONETOUCH VERIO test strip USE TO CHECK BLOOD SUGAR UP TO FOUR TIMES A DAY 100 strip 6   rosuvastatin (CRESTOR) 10 MG tablet Take 1 tablet (10 mg total) by mouth daily. 90 tablet 3   losartan (COZAAR) 50 MG tablet TAKE 1 TABLET BY MOUTH EVERYDAY AT BEDTIME 90 tablet 1   metFORMIN (GLUCOPHAGE-XR) 500 MG 24 hr tablet Take 1 tablet (500 mg total) by mouth daily with breakfast. 90 tablet 1   tirzepatide (MOUNJARO) 5 MG/0.5ML Pen Inject 5 mg into the skin once a week. 6 mL 1   amoxicillin-clavulanate (AUGMENTIN) 875-125 MG tablet Take 1 tablet by mouth 2 (two) times daily. 14 tablet 0   oseltamivir (TAMIFLU) 75 MG capsule Take 1 capsule (75 mg total) by mouth 2 (two) times daily. (Patient not taking: Reported on 04/12/2023) 10 capsule 0   promethazine-dextromethorphan (PROMETHAZINE-DM) 6.25-15 MG/5ML syrup Take 5 mLs by mouth 4 (four) times daily as needed. 118 mL 0   No facility-administered medications prior to visit.    Review of Systems;  Patient denies headache, fevers, malaise, unintentional weight loss, skin rash, eye pain, sinus congestion and sinus pain, sore throat, dysphagia,  hemoptysis , cough, dyspnea, wheezing, chest pain, palpitations, orthopnea, edema, abdominal pain, nausea, melena, diarrhea, constipation, flank pain, dysuria, hematuria, urinary  Frequency, nocturia, numbness, tingling, seizures,  Focal weakness, Loss of consciousness,  Tremor, insomnia, depression,  anxiety, and suicidal ideation.      Objective:  BP 108/76   Pulse (!) 115   Temp 98.1 F (36.7 C) (Oral)   Ht 5' (1.524 m)   Wt 167 lb 9.6 oz (76 kg)   LMP 04/20/2017 (Exact Date)   SpO2 95%   BMI 32.73 kg/m   BP Readings from Last 3 Encounters:  04/12/23 108/76  10/12/22 122/70  09/16/22 110/70    Wt Readings from Last 3 Encounters:  04/12/23 167 lb 9.6 oz (76 kg)  10/12/22 186 lb 6.4 oz (84.6 kg)  09/16/22 188 lb 1.9 oz (85.3 kg)    Physical Exam Vitals  reviewed.  Constitutional:      General: She is not in acute distress.    Appearance: Normal appearance. She is normal weight. She is not ill-appearing, toxic-appearing or diaphoretic.  HENT:     Head: Normocephalic.     Right Ear: Tympanic membrane normal.     Ears:     Comments: Left TM bulging    Nose: Congestion present.     Mouth/Throat:     Pharynx: Posterior oropharyngeal erythema present.     Comments: Tonsils 3+ Eyes:     General: No scleral icterus.       Right eye: No discharge.        Left eye: No discharge.     Conjunctiva/sclera: Conjunctivae normal.  Cardiovascular:     Rate and Rhythm: Normal rate and regular rhythm.     Heart sounds: Normal heart sounds.  Pulmonary:     Effort: Pulmonary effort is normal. No respiratory distress.     Breath sounds: Normal breath sounds.  Musculoskeletal:        General: Normal range of motion.  Skin:    General: Skin is warm and dry.  Neurological:     General: No focal deficit present.     Mental Status: She is alert and oriented to person, place, and time. Mental status is at baseline.  Psychiatric:        Mood and Affect: Mood normal.        Behavior: Behavior normal.        Thought Content: Thought content normal.        Judgment: Judgment normal.    Lab Results  Component Value Date   HGBA1C 6.3 10/12/2022   HGBA1C 7.3 (H) 06/29/2022   HGBA1C 7.2 (H) 03/07/2022    Lab Results  Component Value Date   CREATININE 0.68 10/12/2022   CREATININE 0.73 06/29/2022   CREATININE 0.50 05/30/2022    Lab Results  Component Value Date   WBC 10.5 05/30/2022   HGB 12.7 05/30/2022   HCT 37.4 05/30/2022   PLT 220.0 05/30/2022   GLUCOSE 106 (H) 10/12/2022   CHOL 142 10/12/2022   TRIG 258.0 (H) 10/12/2022   HDL 35.60 (L) 10/12/2022   LDLDIRECT 73.0 10/12/2022   LDLCALC 107 (H) 06/29/2022   ALT 18 10/12/2022   AST 13 10/12/2022   NA 138 10/12/2022   K 3.8 10/12/2022   CL 103 10/12/2022   CREATININE 0.68 10/12/2022    BUN 9 10/12/2022   CO2 29 10/12/2022   TSH 1.78 03/07/2022   HGBA1C 6.3 10/12/2022   MICROALBUR <0.7 03/07/2022    CT ABDOMEN PELVIS W CONTRAST  Result Date: 05/30/2022 CLINICAL DATA:  Acute abdominal pain. EXAM: CT ABDOMEN AND PELVIS WITH CONTRAST TECHNIQUE: Multidetector CT imaging of the abdomen and pelvis was performed using the standard protocol following bolus administration  of intravenous contrast. RADIATION DOSE REDUCTION: This exam was performed according to the departmental dose-optimization program which includes automated exposure control, adjustment of the mA and/or kV according to patient size and/or use of iterative reconstruction technique. CONTRAST:  OMNIPAQUE IOHEXOL 300 MG/ML  SOLN COMPARISON:  None Available. FINDINGS: Lower chest: No acute abnormality. Hepatobiliary: No focal liver abnormality is seen. No gallstones, gallbladder wall thickening, or biliary dilatation. Pancreas: Unremarkable. No pancreatic ductal dilatation or surrounding inflammatory changes. Spleen: Normal in size without focal abnormality. Adrenals/Urinary Tract: Adrenal glands are unremarkable. Kidneys are normal, without renal calculi, focal lesion, or hydronephrosis. Bladder is unremarkable. Stomach/Bowel: Stomach is within normal limits. Appendix appears normal. Moderate-to-severe colonic diverticulosis. Focal sigmoid colonic wall thickening with adjacent fat stranding consistent with acute diverticulitis. Fat stranding and small amount of extraluminal free air, (series 2, image 65; series 5, image 66) concerning for early developing abscess. Vascular/Lymphatic: No significant vascular findings are present. No enlarged abdominal or pelvic lymph nodes. Reproductive: Status post hysterectomy. No adnexal masses. Other: No abdominal wall hernia or abnormality. No abdominopelvic ascites. Musculoskeletal: No acute or significant osseous findings. IMPRESSION: 1. Acute sigmoid colonic diverticulitis. Small amount  of extraluminal free air with adjacent fat stranding concerning for early developing abscess. No drainable fluid collection at this time. Continued follow-up is recommended. 2. Normal appendix. 3. Evidence of nephrolithiasis or hydronephrosis. Electronically Signed   By: Larose Hires D.O.   On: 05/30/2022 16:24    Assessment & Plan:  .Hyperlipidemia, unspecified hyperlipidemia type -     Lipid panel -     LDL cholesterol, direct  Obesity, diabetes, and hypertension syndrome (HCC) Assessment & Plan: Managed with  Mounjaro  For concurrent obesity .  Foot exam done today and normal.  She is tolerating losartan  and rosuvastatin.   Eye exam needed; reminder given. Repeat labs are pending.  Losing weight on 5 mg mounjaro  . Refills given   Lab Results  Component Value Date   HGBA1C 6.3 10/12/2022   Lab Results  Component Value Date   MICROALBUR <0.7 03/07/2022   Lab Results  Component Value Date   CHOL 142 10/12/2022   HDL 35.60 (L) 10/12/2022   LDLCALC 107 (H) 06/29/2022   LDLDIRECT 73.0 10/12/2022   TRIG 258.0 (H) 10/12/2022   CHOLHDL 4 10/12/2022      Orders: -     Hemoglobin A1c -     Microalbumin / creatinine urine ratio -     Losartan Potassium; TAKE 1 TABLET BY MOUTH EVERYDAY AT BEDTIME  Dispense: 90 tablet; Refill: 1 -     Comprehensive metabolic panel  Other fatigue -     CBC with Differential/Platelet -     TSH  Viral syndrome -     POC Influenza A&B(BINAX/QUICKVUE)  Pharyngitis, unspecified etiology Assessment & Plan: Strep negative,  but tonsils are 3+.  Levaquin and prednisone   Orders: -     POCT rapid strep A -     POC COVID-19 BinaxNow  Long-term current use of injectable noninsulin antidiabetic medication Assessment & Plan: She is tolerating mounjaro without abd pain or nausea.  Refills on 5 mg dose given    Acute recurrent maxillary sinusitis Assessment & Plan: Treated one month ago with augmentin.  Given left ear pain and left maxillary sinus  pain,  ab x warranted.  Levaquin and prednisone prescribd.  Continued Daily use of a probiotic advised for 3 weeks.     Other orders -  metFORMIN HCl ER; Take 1 tablet (500 mg total) by mouth daily with breakfast.  Dispense: 90 tablet; Refill: 1 -     levoFLOXacin; Take 1 tablet (500 mg total) by mouth daily for 7 days.  Dispense: 7 tablet; Refill: 0 -     predniSONE; 6 tablets on Day 1 , then reduce by 1 tablet daily until gone  Dispense: 21 tablet; Refill: 0 -     Tirzepatide; Inject 5 mg into the skin once a week.  Dispense: 6 mL; Refill: 1     I provided 30 minutes of face-to-face time during this encounter reviewing patient's last visit with me, patient's  most recent visit with cardiology,  nephrology,  and neurology,  recent surgical and non surgical procedures, previous  labs and imaging studies, counseling on currently addressed issues,  and post visit ordering to diagnostics and therapeutics .   Follow-up: No follow-ups on file.   Sherlene Shams, MD

## 2023-04-12 NOTE — Assessment & Plan Note (Signed)
She is tolerating mounjaro without abd pain or nausea.  Refills on 5 mg dose given

## 2023-04-12 NOTE — Assessment & Plan Note (Signed)
Strep negative,  but tonsils are 3+.  Levaquin and prednisone

## 2023-04-12 NOTE — Assessment & Plan Note (Signed)
Treated one month ago with augmentin.  Given left ear pain and left maxillary sinus pain,  ab x warranted.  Levaquin and prednisone prescribd.  Continued Daily use of a probiotic advised for 3 weeks.

## 2023-05-13 ENCOUNTER — Other Ambulatory Visit: Payer: Self-pay | Admitting: Cardiovascular Disease

## 2023-05-17 NOTE — Telephone Encounter (Signed)
NP visit on 04/12/22 with PRN f/u plan.  Note states:  Patient seen in consultation for Dr. Darrick Huntsman will be referred back to her office for ongoing care of the issues detailed above

## 2023-07-12 ENCOUNTER — Other Ambulatory Visit: Payer: Self-pay | Admitting: Internal Medicine

## 2023-07-12 DIAGNOSIS — I152 Hypertension secondary to endocrine disorders: Secondary | ICD-10-CM

## 2023-10-16 IMAGING — CR DG CHEST 2V
2 series · 2 of 2 positions shown · non-contrast
Comparison: None.

CLINICAL DATA: Chest pain.

EXAM:
CHEST - 2 VIEW

[chest pa]
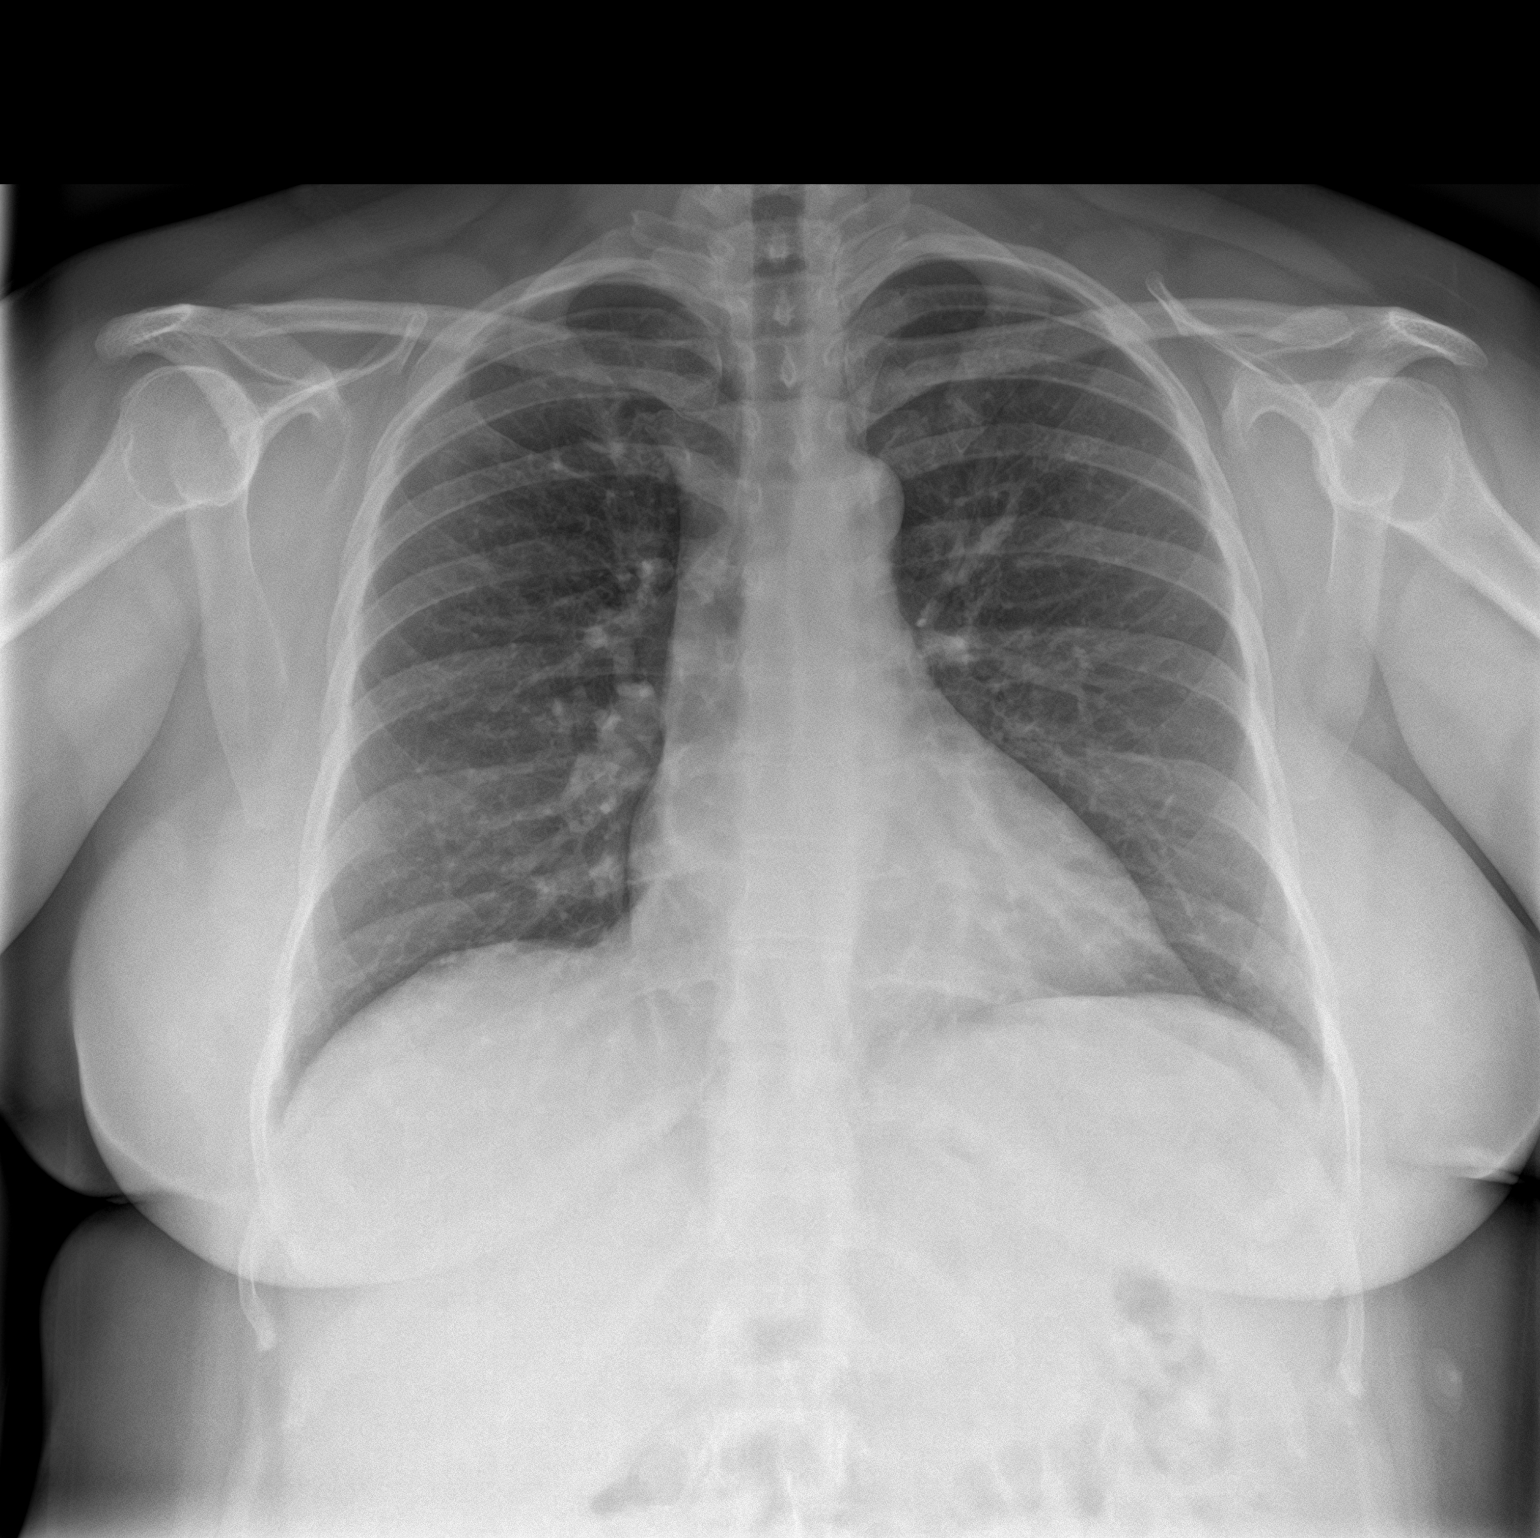

[chest lat]
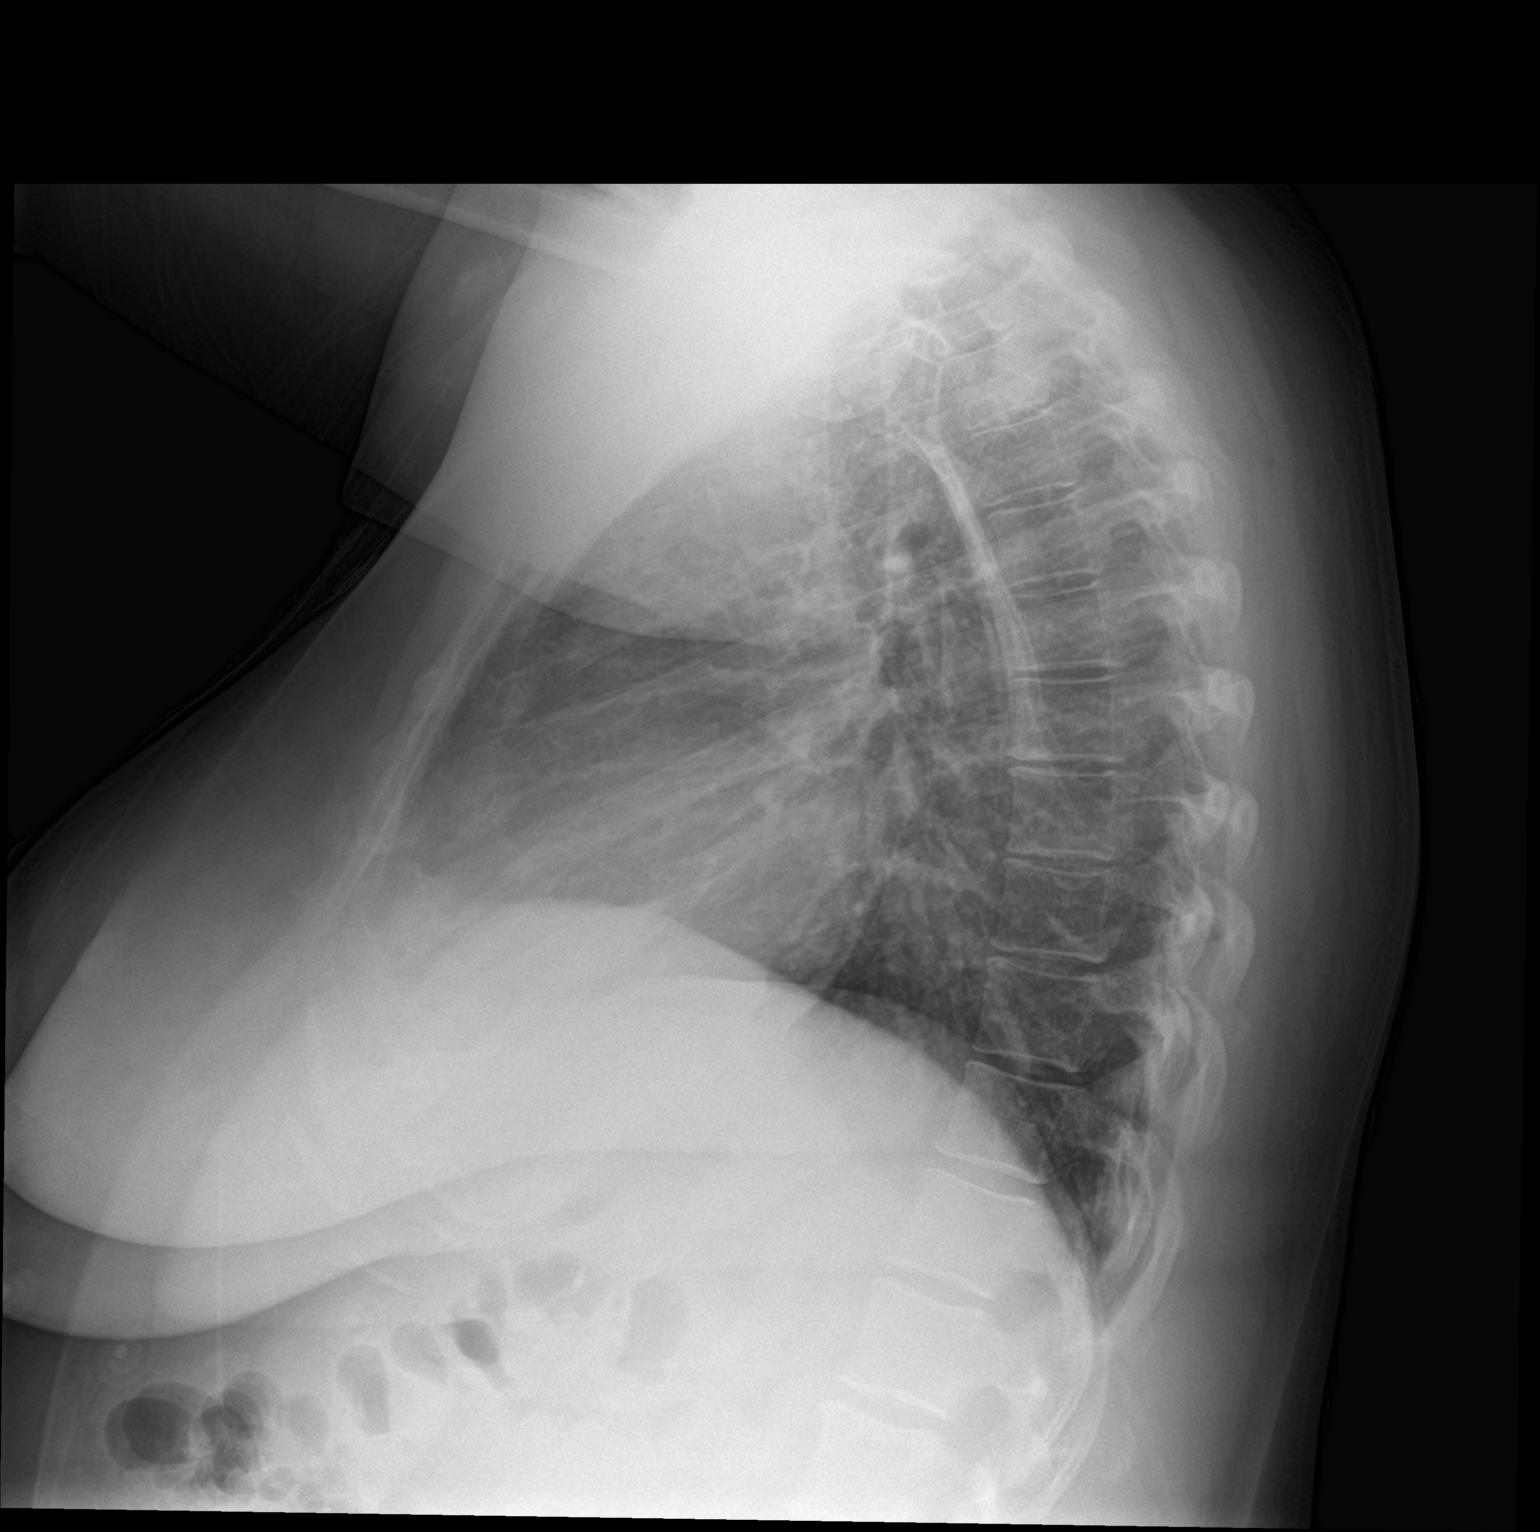

[2 of 2 positions shown; findings below may reference images not displayed]

FINDINGS: The heart size and mediastinal contours are within normal limits.
Both lungs are clear. The visualized skeletal structures are
unremarkable.
IMPRESSION: No active cardiopulmonary disease.

## 2023-11-11 ENCOUNTER — Other Ambulatory Visit: Payer: Self-pay | Admitting: Internal Medicine

## 2023-11-13 ENCOUNTER — Other Ambulatory Visit: Payer: Self-pay | Admitting: Internal Medicine

## 2023-11-16 ENCOUNTER — Other Ambulatory Visit (HOSPITAL_COMMUNITY): Payer: Self-pay

## 2023-11-16 NOTE — Telephone Encounter (Signed)
Insurance card needed for pharmacy benefits

## 2023-11-19 ENCOUNTER — Telehealth: Payer: Commercial Managed Care - PPO | Admitting: Family Medicine

## 2023-11-19 DIAGNOSIS — B9689 Other specified bacterial agents as the cause of diseases classified elsewhere: Secondary | ICD-10-CM | POA: Diagnosis not present

## 2023-11-19 DIAGNOSIS — J019 Acute sinusitis, unspecified: Secondary | ICD-10-CM

## 2023-11-19 DIAGNOSIS — J40 Bronchitis, not specified as acute or chronic: Secondary | ICD-10-CM | POA: Diagnosis not present

## 2023-11-19 MED ORDER — DOXYCYCLINE HYCLATE 100 MG PO TABS: 100.0000 mg | ORAL_TABLET | Freq: Two times a day (BID) | ORAL | 0 refills | Status: AC

## 2023-11-19 MED ORDER — PROMETHAZINE-DM 6.25-15 MG/5ML PO SYRP: 5.0000 mL | ORAL_SOLUTION | Freq: Four times a day (QID) | ORAL | 0 refills | Status: AC | PRN

## 2023-11-19 NOTE — Progress Notes (Signed)
Virtual Visit Consent   Christie Wright, you are scheduled for a virtual visit with a [facility] provider today. Just as with appointments in the office, your consent must be obtained to participate. Your consent will be active for this visit and any virtual visit you may have with one of our providers in the next 365 days. If you have a MyChart account, a copy of this consent can be sent to you electronically.  As this is a virtual visit, video technology does not allow for your provider to perform a traditional examination. This may limit your provider's ability to fully assess your condition. If your provider identifies any concerns that need to be evaluated in person or the need to arrange testing (such as labs, EKG, etc.), we will make arrangements to do so. Although advances in technology are sophisticated, we cannot ensure that it will always work on either your end or our end. If the connection with a video visit is poor, the visit may have to be switched to a telephone visit. With either a video or telephone visit, we are not always able to ensure that we have a secure connection.  By engaging in this virtual visit, you consent to the provision of healthcare and authorize for your insurance to be billed (if applicable) for the services provided during this visit. Depending on your insurance coverage, you may receive a charge related to this service.  I need to obtain your verbal consent now. Are you willing to proceed with your visit today? Christie Wright has provided verbal consent on 11/19/2023 for a virtual visit (video or telephone). Georgana Curio, FNP  Date: 11/19/2023 7:55 PM  Virtual Visit via Video Note   I, Georgana Curio, connected with  Christie Wright  ([id number], [birth year]) on 11/19/23 at  7:45 PM EST by a video-enabled telemedicine application and verified that I am speaking with the correct person using two identifiers.  Location: Patient: Virtual Visit Location  Patient: Home Provider: Virtual Visit Location Provider: Home Office   I discussed the limitations of evaluation and management by telemedicine and the availability of in person appointments. The patient expressed understanding and agreed to proceed.    History of Present Illness: Christie Wright is a 42 y.o. who identifies as a female who was assigned female at birth, and is being seen today for cough, sinus pressure, post nasal driainage, sore throat, low grade fever. Marland Kitchen  HPI: HPI  Problems:  Patient Active Problem List   Diagnosis Date Noted   Long-term current use of injectable noninsulin antidiabetic medication 04/12/2023   Sinusitis, acute maxillary 04/12/2023   Flu 11/08/2022   Diverticulosis 10/12/2022   Pharyngitis 09/17/2022   B12 deficiency 08/04/2022   Obesity, diabetes, and hypertension syndrome (HCC) 03/08/2022   Essential hypertension 03/07/2022   Morbid obesity (HCC) 03/07/2022   Family history of early CAD 03/07/2022   Current every day vaping 03/07/2022   History of tobacco abuse 03/07/2022   Menorrhagia with regular cycle 05/03/2017   Family history of breast cancer 04/04/2017   BRCA gene mutation negative 04/04/2017    Allergies: No Known Allergies Medications:  Current Outpatient Medications:    blood glucose meter kit and supplies, Dispense based on patient and insurance preference. Use to check blood sugars up to four times daily as directed. (FOR ICD-10 E11.9)., Disp: 1 each, Rfl: 0   fluticasone (FLONASE) 50 MCG/ACT nasal spray, Place 2 sprays into both nostrils daily., Disp: 16 g, Rfl: 6  loratadine (CLARITIN) 10 MG tablet, Take 10 mg by mouth daily., Disp: , Rfl:    losartan (COZAAR) 50 MG tablet, TAKE 1 TABLET BY MOUTH EVERYDAY AT BEDTIME, Disp: 90 tablet, Rfl: 1   metFORMIN (GLUCOPHAGE-XR) 500 MG 24 hr tablet, TAKE 1 TABLET BY MOUTH EVERY DAY WITH BREAKFAST, Disp: 90 tablet, Rfl: 1   mometasone (ELOCON) 0.1 % cream, Apply tiwce daily to ear canals  with q tip for eczema, Disp: 15 g, Rfl: 1   ONETOUCH VERIO test strip, USE TO CHECK BLOOD SUGAR UP TO FOUR TIMES A DAY, Disp: 100 strip, Rfl: 6   predniSONE (DELTASONE) 10 MG tablet, 6 tablets on Day 1 , then reduce by 1 tablet daily until gone, Disp: 21 tablet, Rfl: 0   rosuvastatin (CRESTOR) 10 MG tablet, Take 1 tablet (10 mg total) by mouth daily., Disp: 90 tablet, Rfl: 3   tirzepatide (MOUNJARO) 5 MG/0.5ML Pen, INJECT 5 MG SUBCUTANEOUSLY WEEKLY, Disp: 6 mL, Rfl: 1  Observations/Objective: Patient is well-developed, well-nourished in no acute distress.  Resting comfortably  at home.  Head is normocephalic, atraumatic.  No labored breathing.  Speech is clear and coherent with logical content.  Patient is alert and oriented at baseline.    Assessment and Plan: 1. Bronchitis (Primary)  2. Acute bacterial sinusitis  Increase fluids, humidifier at night, tylenol or ibuprofen as directed, UC if sx worsen.   Follow Up Instructions: I discussed the assessment and treatment plan with the patient. The patient was provided an opportunity to ask questions and all were answered. The patient agreed with the plan and demonstrated an understanding of the instructions.  A copy of instructions were sent to the patient via MyChart unless otherwise noted below.     The patient was advised to call back or seek an in-person evaluation if the symptoms worsen or if the condition fails to improve as anticipated.    Georgana Curio, FNP

## 2023-11-19 NOTE — Patient Instructions (Signed)

## 2023-12-05 ENCOUNTER — Telehealth: Payer: Commercial Managed Care - PPO | Admitting: Physician Assistant

## 2023-12-05 DIAGNOSIS — R509 Fever, unspecified: Secondary | ICD-10-CM | POA: Diagnosis not present

## 2023-12-05 DIAGNOSIS — R6889 Other general symptoms and signs: Secondary | ICD-10-CM | POA: Diagnosis not present

## 2023-12-05 MED ORDER — OSELTAMIVIR PHOSPHATE 75 MG PO CAPS: 75.0000 mg | ORAL_CAPSULE | Freq: Two times a day (BID) | ORAL | 0 refills | Status: DC

## 2023-12-05 MED ORDER — PROMETHAZINE-DM 6.25-15 MG/5ML PO SYRP: 5.0000 mL | ORAL_SOLUTION | Freq: Four times a day (QID) | ORAL | 0 refills | Status: DC | PRN

## 2023-12-05 NOTE — Progress Notes (Signed)
 Virtual Visit Consent   Christie Wright, you are scheduled for a virtual visit with a [facility] provider today. Just as with appointments in the office, your consent must be obtained to participate. Your consent will be active for this visit and any virtual visit you may have with one of our providers in the next 365 days. If you have a MyChart account, a copy of this consent can be sent to you electronically.  As this is a virtual visit, video technology does not allow for your provider to perform a traditional examination. This may limit your provider's ability to fully assess your condition. If your provider identifies any concerns that need to be evaluated in person or the need to arrange testing (such as labs, EKG, etc.), we will make arrangements to do so. Although advances in technology are sophisticated, we cannot ensure that it will always work on either your end or our end. If the connection with a video visit is poor, the visit may have to be switched to a telephone visit. With either a video or telephone visit, we are not always able to ensure that we have a secure connection.  By engaging in this virtual visit, you consent to the provision of healthcare and authorize for your insurance to be billed (if applicable) for the services provided during this visit. Depending on your insurance coverage, you may receive a charge related to this service.  I need to obtain your verbal consent now. Are you willing to proceed with your visit today? Christie Wright has provided verbal consent on 12/05/2023 for a virtual visit (video or telephone). Christie CHRISTELLA Dickinson, PA-C  Date: 12/05/2023 7:00 PM  Virtual Visit via Video Note   I, Christie Wright, connected with  Christie Wright  ([id number], 1981/04/20) on 12/05/23 at  7:00 PM EST by a video-enabled telemedicine application and verified that I am speaking with the correct person using two identifiers.  Location: Patient: Virtual Visit  Location Patient: Home Provider: Virtual Visit Location Provider: Home Office   I discussed the limitations of evaluation and management by telemedicine and the availability of in person appointments. The patient expressed understanding and agreed to proceed.    History of Present Illness: Christie Wright is a 43 y.o. who identifies as a female who was assigned female at birth, and is being seen today for flu-like symptoms.  HPI: URI  This is a new problem. The current episode started yesterday. The problem has been gradually worsening. The maximum temperature recorded prior to her arrival was 100.4 - 100.9 F (100.8). Associated symptoms include congestion, coughing, headaches, rhinorrhea, sinus pain and a sore throat (started initially last night). Pertinent negatives include no chest pain, diarrhea, ear pain, nausea, plugged ear sensation, vomiting or wheezing. Associated symptoms comments: General malaise, body aches. She has tried acetaminophen  and NSAIDs for the symptoms. The treatment provided no relief.    Seen Virtually on 11/19/23 and prescribed Doxycycline  and Promethazine  DM for bacterial bronchitis. Had symptom improvement.   Just returned from [place]  after dropping off her son at [employer]. Had many contacts, unsure of any sick.   Problems:  Patient Active Problem List   Diagnosis Date Noted   Long-term current use of injectable noninsulin antidiabetic medication 04/12/2023   Sinusitis, acute maxillary 04/12/2023   Flu 11/08/2022   Diverticulosis 10/12/2022   Pharyngitis 09/17/2022   B12 deficiency 08/04/2022   Obesity, diabetes, and hypertension syndrome (HCC) 03/08/2022   Essential hypertension 03/07/2022  Morbid obesity (HCC) 03/07/2022   Family history of early CAD 03/07/2022   Current every day vaping 03/07/2022   History of tobacco abuse 03/07/2022   Menorrhagia with regular cycle 05/03/2017   Family history of breast cancer 04/04/2017   BRCA gene mutation  negative 04/04/2017    Allergies: No Known Allergies Medications:  Current Outpatient Medications:    blood glucose meter kit and supplies, Dispense based on patient and insurance preference. Use to check blood sugars up to four times daily as directed. (FOR ICD-10 E11.9)., Disp: 1 each, Rfl: 0   fluticasone  (FLONASE ) 50 MCG/ACT nasal spray, Place 2 sprays into both nostrils daily., Disp: 16 g, Rfl: 6   loratadine (CLARITIN) 10 MG tablet, Take 10 mg by mouth daily., Disp: , Rfl:    losartan  (COZAAR ) 50 MG tablet, TAKE 1 TABLET BY MOUTH EVERYDAY AT BEDTIME, Disp: 90 tablet, Rfl: 1   metFORMIN  (GLUCOPHAGE -XR) 500 MG 24 hr tablet, TAKE 1 TABLET BY MOUTH EVERY DAY WITH BREAKFAST, Disp: 90 tablet, Rfl: 1   mometasone  (ELOCON ) 0.1 % cream, Apply tiwce daily to ear canals with q tip for eczema, Disp: 15 g, Rfl: 1   ONETOUCH VERIO test strip, USE TO CHECK BLOOD SUGAR UP TO FOUR TIMES A DAY, Disp: 100 strip, Rfl: 6   oseltamivir  (TAMIFLU ) 75 MG capsule, Take 1 capsule (75 mg total) by mouth 2 (two) times daily., Disp: 10 capsule, Rfl: 0   predniSONE  (DELTASONE ) 10 MG tablet, 6 tablets on Day 1 , then reduce by 1 tablet daily until gone, Disp: 21 tablet, Rfl: 0   promethazine -dextromethorphan (PROMETHAZINE -DM) 6.25-15 MG/5ML syrup, Take 5 mLs by mouth 4 (four) times daily as needed., Disp: 118 mL, Rfl: 0   rosuvastatin  (CRESTOR ) 10 MG tablet, Take 1 tablet (10 mg total) by mouth daily., Disp: 90 tablet, Rfl: 3   tirzepatide  (MOUNJARO ) 5 MG/0.5ML Pen, INJECT 5 MG SUBCUTANEOUSLY WEEKLY, Disp: 6 mL, Rfl: 1  Observations/Objective: Patient is well-developed, well-nourished in no acute distress.  Resting comfortably at home.  Appears ill.  Head is normocephalic, atraumatic.  No labored breathing.  Speech is clear and coherent with logical content.  Patient is alert and oriented at baseline.  Slight dry cough heard twice through call not affecting speech   Assessment and Plan: 1. Flu-like symptoms  (Primary) - oseltamivir  (TAMIFLU ) 75 MG capsule; Take 1 capsule (75 mg total) by mouth 2 (two) times daily.  Dispense: 10 capsule; Refill: 0 - promethazine -dextromethorphan (PROMETHAZINE -DM) 6.25-15 MG/5ML syrup; Take 5 mLs by mouth 4 (four) times daily as needed.  Dispense: 118 mL; Refill: 0  - Suspected viral URI  - Possible flu as flu A is prevalent in the community at this time - Limited testing availability that would delay appropriate treatment waiting on results - Tamiflu  prescribed for possible flu - Push fluids - Symptomatic management OTC of choice as needed - Seek in person evaluation if symptoms worsen or fail to improve   Follow Up Instructions: I discussed the assessment and treatment plan with the patient. The patient was provided an opportunity to ask questions and all were answered. The patient agreed with the plan and demonstrated an understanding of the instructions.  A copy of instructions were sent to the patient via MyChart unless otherwise noted below.    The patient was advised to call back or seek an in-person evaluation if the symptoms worsen or if the condition fails to improve as anticipated.    Christie CHRISTELLA Dickinson, PA-C

## 2023-12-05 NOTE — Patient Instructions (Signed)
 Christie Wright, thank you for joining Christie CHRISTELLA Dickinson, PA-C for today's virtual visit.  While this provider is not your primary care provider (PCP), if your PCP is located in our provider database this encounter information will be shared with them immediately following your visit.   A [place] MyChart account gives you access to today's visit and all your visits, tests, and labs performed at [facility]  click here if you don't have a [place] MyChart account or go to [email or web address]  Consent: (Patient) Christie Wright provided verbal consent for this virtual visit at the beginning of the encounter.  Current Medications:  Current Outpatient Medications:    blood glucose meter kit and supplies, Dispense based on patient and insurance preference. Use to check blood sugars up to four times daily as directed. (FOR ICD-10 E11.9)., Disp: 1 each, Rfl: 0   fluticasone  (FLONASE ) 50 MCG/ACT nasal spray, Place 2 sprays into both nostrils daily., Disp: 16 g, Rfl: 6   loratadine (CLARITIN) 10 MG tablet, Take 10 mg by mouth daily., Disp: , Rfl:    losartan  (COZAAR ) 50 MG tablet, TAKE 1 TABLET BY MOUTH EVERYDAY AT BEDTIME, Disp: 90 tablet, Rfl: 1   metFORMIN  (GLUCOPHAGE -XR) 500 MG 24 hr tablet, TAKE 1 TABLET BY MOUTH EVERY DAY WITH BREAKFAST, Disp: 90 tablet, Rfl: 1   mometasone  (ELOCON ) 0.1 % cream, Apply tiwce daily to ear canals with q tip for eczema, Disp: 15 g, Rfl: 1   ONETOUCH VERIO test strip, USE TO CHECK BLOOD SUGAR UP TO FOUR TIMES A DAY, Disp: 100 strip, Rfl: 6   oseltamivir  (TAMIFLU ) 75 MG capsule, Take 1 capsule (75 mg total) by mouth 2 (two) times daily., Disp: 10 capsule, Rfl: 0   predniSONE  (DELTASONE ) 10 MG tablet, 6 tablets on Day 1 , then reduce by 1 tablet daily until gone, Disp: 21 tablet, Rfl: 0   promethazine -dextromethorphan (PROMETHAZINE -DM) 6.25-15 MG/5ML syrup, Take 5 mLs by mouth 4 (four) times daily as needed., Disp: 118 mL, Rfl: 0    rosuvastatin  (CRESTOR ) 10 MG tablet, Take 1 tablet (10 mg total) by mouth daily., Disp: 90 tablet, Rfl: 3   tirzepatide  (MOUNJARO ) 5 MG/0.5ML Pen, INJECT 5 MG SUBCUTANEOUSLY WEEKLY, Disp: 6 mL, Rfl: 1   Medications ordered in this encounter:  Meds ordered this encounter  Medications   oseltamivir  (TAMIFLU ) 75 MG capsule    Sig: Take 1 capsule (75 mg total) by mouth 2 (two) times daily.    Dispense:  10 capsule    Refill:  0    Supervising Provider:   LAMPTEY, PHILIP O [[id number]]   promethazine -dextromethorphan (PROMETHAZINE -DM) 6.25-15 MG/5ML syrup    Sig: Take 5 mLs by mouth 4 (four) times daily as needed.    Dispense:  118 mL    Refill:  0    Supervising Provider:   BLAISE ALEENE KIDD [[id number]]     *If you need refills on other medications prior to your next appointment, please contact your pharmacy*  Follow-Up: Call back or seek an in-person evaluation if the symptoms worsen or if the condition fails to improve as anticipated.  [facility] [phone]  Other Instructions Influenza, Adult Influenza is also called the flu. It's an infection that affects your respiratory tract. This includes your nose, throat, windpipe, and lungs. The flu is contagious. This means it spreads easily from person to person. It causes symptoms that are like a cold. It can also cause a high fever and body aches.  What are the causes? The flu is caused by the influenza virus. You can get it by: Breathing in droplets that are in the air after an infected person coughs or sneezes. Touching something that has the virus on it and then touching your mouth, nose, or eyes. What increases the risk? You may be more likely to get the flu if: You don't wash your hands often. You're near a lot of people during cold and flu season. You touch your mouth, eyes, or nose without washing your hands first. You don't get a flu shot each year. You may also be more at risk for the flu and serious problems,  such as a lung infection called pneumonia, if: You're older than 65. You're pregnant. Your immune system is weak. Your immune system is your body's defense system. You have a long-term, or chronic, condition, such as: Heart, kidney, or lung disease. Diabetes. A liver disorder. Asthma. You're very overweight. You have anemia. This is when you don't have enough red blood cells in your body. What are the signs or symptoms? Flu symptoms often start all of a sudden. They may last 4-14 days and include: Fever and chills. Headaches, body aches, or muscle aches. Sore throat. Cough. Runny or stuffy nose. Discomfort in your chest. Not wanting to eat as much as normal. Feeling weak or tired. Feeling dizzy. Nausea or vomiting. How is this diagnosed? The flu may be diagnosed based on your symptoms and medical history. You may also have a physical exam. A swab may be taken from your nose or throat and tested for the virus. How is this treated? If the flu is found early, you can be treated with antiviral medicine. This may be given to you by mouth or through an IV. It can help you feel less sick and get better faster. Taking care of yourself at home can also help your symptoms get better. Your health care provider may tell you to: Take over-the-counter medicines. Drink lots of fluids. The flu often goes away on its own. If you have very bad symptoms or problems caused by the flu, you may need to be treated in a hospital. Follow these instructions at home: Activity Rest as needed. Get lots of sleep. Stay home from work or school as told by your provider. Leave home only to go see your provider. Do not leave home for other reasons until you don't have a fever for 24 hours without taking medicine. Eating and drinking Take an oral rehydration solution (ORS). This is a drink that is sold at pharmacies and stores. Drink enough fluid to keep your pee pale yellow. Try to drink small amounts of clear  fluids. These include water, ice chips, fruit juice mixed with water, and low-calorie sports drinks. Try to eat bland foods that are easy to digest. These include bananas, applesauce, rice, lean meats, toast, and crackers. Avoid drinks that have a lot of sugar or caffeine in them. These include energy drinks, regular sports drinks, and soda. Do not drink alcohol. Do not eat spicy or fatty foods. General instructions     Take your medicines only as told by your provider. Use a cool mist humidifier to add moisture to the air in your home. This can make it easier for you to breathe. You should also clean the humidifier every day. To do so: Empty the water. Pour clean water in. Cover your mouth and nose when you cough or sneeze. Wash your hands with soap and water often  and for at least 20 seconds. It's extra important to do so after you cough or sneeze. If you can't use soap and water, use hand sanitizer. How is this prevented?  Get a flu shot every year. Ask your provider when you should get your flu shot. Stay away from people who are sick during fall and winter. Fall and winter are cold and flu season. Contact a health care provider if: You get new symptoms. You have chest pain. You have watery poop, also called diarrhea. You have a fever. Your cough gets worse. You start to have more mucus. You feel like you may vomit, or you vomit. Get help right away if: You become short of breath or have trouble breathing. Your skin or nails turn blue. You have very bad pain or stiffness in your neck. You get a sudden headache or pain in your face or ear. You vomit each time you eat or drink. These symptoms may be an emergency. Call 911 right away. Do not wait to see if the symptoms will go away. Do not drive yourself to the hospital. This information is not intended to replace advice given to you by your health care provider. Make sure you discuss any questions you have with your health care  provider. Document Revised: 12/22/2022 Document Reviewed: 12/22/2022 Elsevier Patient Education  2024 Elsevier Inc.    If you have been instructed to have an in-person evaluation today at a local Urgent Care facility, please use the link below. It will take you to a list of all of our available [facility], including address, phone number and hours of operation. Please do not delay care.  [facility]  If you or a family member do not have a primary care provider, use the link below to schedule a visit and establish care. When you choose a [facility] primary care physician or advanced practice provider, you gain a long-term partner in health. Find a Primary Care Provider  Learn more about [facility]'s in-office and virtual care options: [facility] - Get Care Now

## 2023-12-27 LAB — HM DIABETES EYE EXAM

## 2024-02-27 ENCOUNTER — Telehealth: Payer: Self-pay | Admitting: Internal Medicine

## 2024-02-27 NOTE — Telephone Encounter (Signed)
 Dr Darrick Huntsman will not be in the office for your scheduled Physical visit on 04/05/24. Please call the office at [phone] to reschedule.  Thank you  E2C2, please reschedule this pt's appt. [facility]

## 2024-03-06 ENCOUNTER — Other Ambulatory Visit: Payer: Self-pay | Admitting: Internal Medicine

## 2024-03-06 DIAGNOSIS — I152 Hypertension secondary to endocrine disorders: Secondary | ICD-10-CM

## 2024-03-06 NOTE — Telephone Encounter (Signed)
 Spoke with pt and scheduled her for a physical on 04/12/2024.

## 2024-04-05 ENCOUNTER — Ambulatory Visit: Admitting: Internal Medicine

## 2024-04-05 ENCOUNTER — Encounter: Admitting: Internal Medicine

## 2024-04-05 ENCOUNTER — Encounter (HOSPITAL_COMMUNITY): Payer: Self-pay

## 2024-04-12 ENCOUNTER — Other Ambulatory Visit (HOSPITAL_COMMUNITY): Payer: Self-pay

## 2024-04-12 ENCOUNTER — Telehealth: Payer: Self-pay

## 2024-04-12 ENCOUNTER — Ambulatory Visit: Admitting: Internal Medicine

## 2024-04-12 VITALS — BP 106/78 | HR 82 | Ht 60.0 in | Wt 168.8 lb

## 2024-04-12 DIAGNOSIS — Z1231 Encounter for screening mammogram for malignant neoplasm of breast: Secondary | ICD-10-CM

## 2024-04-12 DIAGNOSIS — I10 Essential (primary) hypertension: Secondary | ICD-10-CM

## 2024-04-12 DIAGNOSIS — E785 Hyperlipidemia, unspecified: Secondary | ICD-10-CM

## 2024-04-12 DIAGNOSIS — E669 Obesity, unspecified: Secondary | ICD-10-CM

## 2024-04-12 DIAGNOSIS — R5383 Other fatigue: Secondary | ICD-10-CM

## 2024-04-12 DIAGNOSIS — E1169 Type 2 diabetes mellitus with other specified complication: Secondary | ICD-10-CM

## 2024-04-12 DIAGNOSIS — E1159 Type 2 diabetes mellitus with other circulatory complications: Secondary | ICD-10-CM

## 2024-04-12 DIAGNOSIS — Z0000 Encounter for general adult medical examination without abnormal findings: Secondary | ICD-10-CM

## 2024-04-12 DIAGNOSIS — I152 Hypertension secondary to endocrine disorders: Secondary | ICD-10-CM

## 2024-04-12 DIAGNOSIS — Z7984 Long term (current) use of oral hypoglycemic drugs: Secondary | ICD-10-CM

## 2024-04-12 DIAGNOSIS — Z7985 Long-term (current) use of injectable non-insulin antidiabetic drugs: Secondary | ICD-10-CM

## 2024-04-12 MED ORDER — TIRZEPATIDE 7.5 MG/0.5ML ~~LOC~~ SOAJ: 7.5000 mg | SUBCUTANEOUS | 2 refills | Status: DC

## 2024-04-12 MED ORDER — METFORMIN HCL ER 500 MG PO TB24: ORAL_TABLET | ORAL | 1 refills | Status: DC

## 2024-04-12 NOTE — Progress Notes (Signed)
 Patient ID: Christie Wright, female    DOB: 1980-12-28  Age: 43 y.o. MRN: [id number]  The patient is here for annual preventive examination and management of other chronic and acute problems.   The risk factors are reflected in the social history.   The roster of all physicians providing medical care to patient - is listed in the Snapshot section of the chart.   Activities of daily living:  The patient is 100% independent in all ADLs: dressing, toileting, feeding as well as independent mobility   Home safety : The patient has smoke detectors in the home. They wear seatbelts.  There are no unsecured firearms at home. There is no violence in the home.    There is no risks for hepatitis, STDs or HIV. There is no   history of blood transfusion. They have no travel history to infectious disease endemic areas of the world.   The patient has seen their dentist in the last six month. They have seen their eye doctor in the last year. The patinet  denies slight hearing difficulty with regard to whispered voices and some television programs.  They have deferred audiologic testing in the last year.  They do not  have excessive sun exposure. Discussed the need for sun protection: hats, long sleeves and use of sunscreen if there is significant sun exposure.    Diet: the importance of a healthy diet is discussed. They do have a healthy diet.   The benefits of regular aerobic exercise were discussed. The patient  exercises  3 to 5 days per week  for  60 minutes.    Depression screen: there are no signs or vegative symptoms of depression- irritability, change in appetite, anhedonia, sadness/tearfullness.   The following portions of the patient's history were reviewed and updated as appropriate: allergies, current medications, past family history, past medical history,  past surgical history, past social history  and problem list.   Visual acuity was not assessed per patient preference since the patient has  regular follow up with an  ophthalmologist. Hearing and body mass index were assessed and reviewed.    During the course of the visit the patient was educated and counseled about appropriate screening and preventive services including : fall prevention , diabetes screening, nutrition counseling, colorectal cancer screening, and recommended immunizations.    Chief Complaint:    1) Type 2 DM/obesity/HTN:  She  feels generally well  and is walking for exercise regularly . Checking  blood sugars less than once daily at variable times, usually only if she feels she may be having a hypoglycemic event. .  BS have been under 130 fasting and < 150 post prandially.  Denies any recent hypoglyemic events.  Taking   medications as directed. Following a carbohydrate modified diet 6 an ys per week. Denies numbness, burning and tingling of extremities. Appetite is good.     Review of Symptoms  Patient denies headache, fevers, malaise, unintentional weight loss, skin rash, eye pain, sinus congestion and sinus pain, sore throat, dysphagia,  hemoptysis , cough, dyspnea, wheezing, chest pain, palpitations, orthopnea, edema, abdominal pain, nausea, melena, diarrhea, constipation, flank pain, dysuria, hematuria, urinary  Frequency, nocturia, numbness, tingling, seizures,  Focal weakness, Loss of consciousness,  Tremor, insomnia, depression, anxiety, and suicidal ideation.    Physical Exam:  BP 106/78   Pulse 82   Ht 5' (1.524 m)   Wt 168 lb 12.8 oz (76.6 kg)   LMP 04/20/2017 (Exact Date)   SpO2 97%  BMI 32.97 kg/m    Physical Exam Vitals reviewed.  Constitutional:      General: She is not in acute distress.    Appearance: Normal appearance. She is normal weight. She is not ill-appearing, toxic-appearing or diaphoretic.  HENT:     Head: Normocephalic.  Eyes:     General: No scleral icterus.       Right eye: No discharge.        Left eye: No discharge.     Conjunctiva/sclera: Conjunctivae normal.   Cardiovascular:     Rate and Rhythm: Normal rate and regular rhythm.     Heart sounds: Normal heart sounds.  Pulmonary:     Effort: Pulmonary effort is normal. No respiratory distress.     Breath sounds: Normal breath sounds.  Musculoskeletal:        General: Normal range of motion.  Skin:    General: Skin is warm and dry.  Neurological:     General: No focal deficit present.     Mental Status: She is alert and oriented to person, place, and time. Mental status is at baseline.  Psychiatric:        Mood and Affect: Mood normal.        Behavior: Behavior normal.        Thought Content: Thought content normal.        Judgment: Judgment normal.    Assessment and Plan: Obesity, diabetes, and hypertension syndrome (HCC) Assessment & Plan: Managed with  Mounjaro   For concurrent obesity .  Foot exam done today and normal.  She is tolerating losartan   and rosuvastatin .   Eye exam needed; reminder given. Repeat labs are pending.  Weight has plateaued on  5 mg mounjaro   .increasing to 7.5 mg    Lab Results  Component Value Date   HGBA1C 5.5 04/12/2023   Lab Results  Component Value Date   MICROALBUR <0.7 03/07/2022   Lab Results  Component Value Date   CHOL 158 04/12/2023   HDL 54.10 04/12/2023   LDLCALC 79 04/12/2023   LDLDIRECT 76.0 04/12/2023   TRIG 126.0 04/12/2023   CHOLHDL 3 04/12/2023      Orders: -     Comprehensive metabolic panel with GFR -     Hemoglobin A1c -     Microalbumin / creatinine urine ratio  Other fatigue -     CBC with Differential/Platelet -     TSH  Hyperlipidemia, unspecified hyperlipidemia type -     LDL cholesterol, direct -     Lipid panel  Encounter for screening mammogram for malignant neoplasm of breast -     3D Screening Mammogram, Left and Right; Future  Essential hypertension Assessment & Plan: Well controlled on current regimen. Renal function stable, no changes today.   Lab Results  Component Value Date   CREATININE 0.67  04/12/2024   Lab Results  Component Value Date   NA 138 04/12/2024   K 3.8 04/12/2024   CL 102 04/12/2024   CO2 22 04/12/2024      Morbid obesity (HCC) Assessment & Plan: With diabetes and hypertension .  Previous weight loss of 30 lbs with [employer] diet.  Now iwith a 20 lb weight loss since starting   Mounjaro ,,    Other orders -     metFORMIN  HCl ER; TAKE 1 TABLET BY MOUTH EVERY DAY WITH BREAKFAST  Dispense: 90 tablet; Refill: 1 -     Tirzepatide ; Inject 7.5 mg into the skin once a week.  Dispense: 6 mL; Refill: 2    Return in about 6 months (around 10/13/2024) for follow up diabetes.  Thersia Flax, MD

## 2024-04-12 NOTE — Patient Instructions (Addendum)
 YOUR MAMMOGRAM IS DUE, PLEASE CALL AND GET THIS SCHEDULED! [facility] - call [phone]   I have increased your Mounjaro  dose to 7.5 mg daily    You will very likely be advised to resume Crestor  as  you "primary prevention " of heart disease  Please return in 6 months

## 2024-04-12 NOTE — Telephone Encounter (Signed)
 Pharmacy Patient Advocate Encounter   Received notification from CoverMyMeds that prior authorization for Mounjaro  7.5MG /0.5ML auto-injectors is required/requested.   Insurance verification completed.   The patient is insured through [employer] .   Per test claim: PA required; PA submitted to above mentioned insurance via Phone Key/confirmation #/EOC [id number] Status is pending

## 2024-04-12 NOTE — Assessment & Plan Note (Addendum)
 Managed with  Mounjaro   For concurrent obesity .  Foot exam done today and normal.  She is tolerating losartan   and rosuvastatin .   Eye exam needed; reminder given. Repeat labs are pending.  Weight has plateaued on  5 mg mounjaro   .increasing to 7.5 mg    Lab Results  Component Value Date   HGBA1C 5.5 04/12/2023   Lab Results  Component Value Date   MICROALBUR <0.7 03/07/2022   Lab Results  Component Value Date   CHOL 158 04/12/2023   HDL 54.10 04/12/2023   LDLCALC 79 04/12/2023   LDLDIRECT 76.0 04/12/2023   TRIG 126.0 04/12/2023   CHOLHDL 3 04/12/2023

## 2024-04-13 DIAGNOSIS — Z0000 Encounter for general adult medical examination without abnormal findings: Secondary | ICD-10-CM | POA: Insufficient documentation

## 2024-04-13 LAB — COMPREHENSIVE METABOLIC PANEL WITH GFR
ALT: 13 IU/L (ref 0–32)
AST: 15 IU/L (ref 0–40)
Albumin: 4.7 g/dL (ref 3.9–4.9)
Alkaline Phosphatase: 96 IU/L (ref 44–121)
BUN/Creatinine Ratio: 12 (ref 9–23)
BUN: 8 mg/dL (ref 6–24)
Bilirubin Total: 0.4 mg/dL (ref 0.0–1.2)
CO2: 22 mmol/L (ref 20–29)
Calcium: 9.5 mg/dL (ref 8.7–10.2)
Chloride: 102 mmol/L (ref 96–106)
Creatinine, Ser: 0.67 mg/dL (ref 0.57–1.00)
Globulin, Total: 2.7 g/dL (ref 1.5–4.5)
Glucose: 88 mg/dL (ref 70–99)
Potassium: 3.8 mmol/L (ref 3.5–5.2)
Sodium: 138 mmol/L (ref 134–144)
Total Protein: 7.4 g/dL (ref 6.0–8.5)
eGFR: 112 mL/min/{1.73_m2} (ref >59)

## 2024-04-13 LAB — CBC WITH DIFFERENTIAL/PLATELET
Basophils Absolute: 0 10*3/uL (ref 0.0–0.2)
Basos: 1 %
EOS (ABSOLUTE): 0.1 10*3/uL (ref 0.0–0.4)
Eos: 2 %
Hematocrit: 43.3 % (ref 34.0–46.6)
Hemoglobin: 14.5 g/dL (ref 11.1–15.9)
Immature Grans (Abs): 0 10*3/uL (ref 0.0–0.1)
Immature Granulocytes: 0 %
Lymphocytes Absolute: 2.2 10*3/uL (ref 0.7–3.1)
Lymphs: 36 %
MCH: 31.2 pg (ref 26.6–33.0)
MCHC: 33.5 g/dL (ref 31.5–35.7)
MCV: 93 fL (ref 79–97)
Monocytes Absolute: 0.5 10*3/uL (ref 0.1–0.9)
Monocytes: 7 %
Neutrophils Absolute: 3.4 10*3/uL (ref 1.4–7.0)
Neutrophils: 54 %
Platelets: 258 10*3/uL (ref 150–450)
RBC: 4.65 x10E6/uL (ref 3.77–5.28)
RDW: 11.9 % (ref 11.7–15.4)
WBC: 6.2 10*3/uL (ref 3.4–10.8)

## 2024-04-13 LAB — LIPID PANEL
Chol/HDL Ratio: 5.9 ratio — ABNORMAL HIGH (ref 0.0–4.4)
Cholesterol, Total: 242 mg/dL — ABNORMAL HIGH (ref 100–199)
HDL: 41 mg/dL (ref >39)
LDL Chol Calc (NIH): 156 mg/dL — ABNORMAL HIGH (ref 0–99)
Triglycerides: 245 mg/dL — ABNORMAL HIGH (ref 0–149)
VLDL Cholesterol Cal: 45 mg/dL — ABNORMAL HIGH (ref 5–40)

## 2024-04-13 LAB — HEMOGLOBIN A1C
Est. average glucose Bld gHb Est-mCnc: 105 mg/dL
Hgb A1c MFr Bld: 5.3 % (ref 4.8–5.6)

## 2024-04-13 LAB — MICROALBUMIN / CREATININE URINE RATIO
Creatinine, Urine: 21.1 mg/dL
Microalb/Creat Ratio: <14 mg/g{creat} (ref 0–29)
Microalbumin, Urine: <3 ug/mL

## 2024-04-13 LAB — LDL CHOLESTEROL, DIRECT: LDL Direct: 152 mg/dL — ABNORMAL HIGH (ref 0–99)

## 2024-04-13 LAB — TSH: TSH: 1.11 u[IU]/mL (ref 0.450–4.500)

## 2024-04-13 NOTE — Assessment & Plan Note (Signed)
 Well controlled on current regimen. Renal function stable, no changes today.   Lab Results  Component Value Date   CREATININE 0.67 04/12/2024   Lab Results  Component Value Date   NA 138 04/12/2024   K 3.8 04/12/2024   CL 102 04/12/2024   CO2 22 04/12/2024

## 2024-04-13 NOTE — Assessment & Plan Note (Signed)
 With diabetes and hypertension .  Previous weight loss of 30 lbs with Optavia diet.  Now iwith a 20 lb weight loss since starting   Mounjaro ,,

## 2024-04-14 ENCOUNTER — Encounter: Payer: Self-pay | Admitting: Internal Medicine

## 2024-04-16 ENCOUNTER — Other Ambulatory Visit (HOSPITAL_COMMUNITY): Payer: Self-pay

## 2024-04-16 ENCOUNTER — Telehealth: Payer: Self-pay

## 2024-04-16 NOTE — Telephone Encounter (Signed)
 PA request has been Submitted. New Encounter has been or will be created for follow up. For additional info see Pharmacy Prior Auth telephone encounter from 04/16/24.

## 2024-04-16 NOTE — Telephone Encounter (Signed)
 Pharmacy Patient Advocate Encounter   Received notification from CoverMyMeds that prior authorization for Mounjaro  7.5MG /0.5ML auto-injectors is required/requested.   Insurance verification completed.   The patient is insured through [employer] .   Per test claim: PA required; PA submitted to above mentioned insurance via Prompt PA Key/confirmation #/[id number] Status is pending

## 2024-04-17 NOTE — Telephone Encounter (Signed)
 Pharmacy Patient Advocate Encounter  Received notification from [employer] that Prior Authorization for Mounjaro  7.5MG /0.5ML auto-injectors has been APPROVED from 04-16-2024 to 04-15-2025   PA #/Case ID/Reference #: [id number]

## 2024-04-24 MED ORDER — ROSUVASTATIN CALCIUM 10 MG PO TABS: 10.0000 mg | ORAL_TABLET | Freq: Every day | ORAL | 3 refills | Status: DC

## 2024-08-19 ENCOUNTER — Telehealth: Payer: Self-pay

## 2024-08-19 MED ORDER — TIRZEPATIDE 5 MG/0.5ML ~~LOC~~ SOAJ: 5.0000 mg | SUBCUTANEOUS | 2 refills | Status: DC

## 2024-08-19 NOTE — Addendum Note (Signed)
 Addended by: MARYLYNN VERNEITA CROME on: 08/19/2024 03:35 PM   Modules accepted: Orders

## 2024-08-19 NOTE — Telephone Encounter (Signed)
 Pt is aware and gave a verbal understanding.

## 2024-08-19 NOTE — Telephone Encounter (Signed)
 Copied from CRM #[id number]. Topic: Clinical - Medication Question >> Aug 19, 2024 11:59 AM Avram MATSU wrote: Reason for CRM: pt stated she took her last dose of tirzepatide  (MOUNJARO ) 7.5 MG/0.5ML Pen [[id number]] and it made her puke/every week once she take the dose. She would like to go back down to the 5mg . Please advise [phone]

## 2024-08-19 NOTE — Telephone Encounter (Signed)
 Pt would like to go back to the 5 mg Mounjaro  due to the 7.5 mg dose making her vomit after taking.

## 2024-09-14 ENCOUNTER — Other Ambulatory Visit: Payer: Self-pay | Admitting: Internal Medicine

## 2024-09-14 DIAGNOSIS — E119 Type 2 diabetes mellitus without complications: Secondary | ICD-10-CM

## 2024-10-07 ENCOUNTER — Ambulatory Visit (INDEPENDENT_AMBULATORY_CARE_PROVIDER_SITE_OTHER): Admitting: Family Medicine

## 2024-10-07 VITALS — BP 132/78 | HR 107 | Temp 98.1°F | Resp 16 | Ht 60.0 in | Wt 182.0 lb

## 2024-10-07 DIAGNOSIS — J069 Acute upper respiratory infection, unspecified: Secondary | ICD-10-CM | POA: Diagnosis not present

## 2024-10-07 LAB — POC COVID19/FLU A&B COMBO
Covid Antigen, POC: NEGATIVE
Influenza A Antigen, POC: NEGATIVE
Influenza B Antigen, POC: NEGATIVE

## 2024-10-07 MED ORDER — BENZONATATE 100 MG PO CAPS: 100.0000 mg | ORAL_CAPSULE | Freq: Three times a day (TID) | ORAL | 0 refills | Status: DC | PRN

## 2024-10-07 MED ORDER — HYDROCOD POLI-CHLORPHE POLI ER 10-8 MG/5ML PO SUER: 5.0000 mL | Freq: Every evening | ORAL | 0 refills | Status: DC | PRN

## 2024-10-07 NOTE — Progress Notes (Signed)
 Name: Christie Wright   MRN: [id number]    DOB: 07-12-1981   Date:10/07/2024       Progress Note  Subjective  Chief Complaint  Chief Complaint  Patient presents with   Sinusitis    X4 days, OTC meds not resolving   Cough    Discussed the use of AI scribe software for clinical note transcription with the patient, who gave verbal consent to proceed.  History of Present Illness Christie Wright is a 43 year old female with diabetes who presents with cough and fever.  Her symptoms began on Friday morning with a scratchy throat and sinus congestion. She started taking over-the-counter cold and flu medication on the same day. By Saturday, she developed a fever, which she has been managing with ibuprofen and Tylenol . Her fever was recorded at 100.59F this morning.  She describes her cough as the most bothersome symptom, stating that it keeps her up all night. She also experiences sinus pressure, runny nose, and hoarseness. She has tried using saline spray to alleviate her symptoms. She notes body aches, describing them as feeling like it weighs a hundred pounds.  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 She has a history of sinus infections but has not experienced this particular illness before.  She does not smoke and has no history of asthma. She is currently taking medications for her symptoms, including DayQuil, NyQuil, and sinus medications.    Patient Active Problem List   Diagnosis Date Noted   Encounter for preventive health examination 04/13/2024   Long-term current use of injectable noninsulin antidiabetic medication 04/12/2023   Sinusitis, acute maxillary 04/12/2023   Diverticulosis 10/12/2022   B12 deficiency 08/04/2022   Obesity, diabetes, and hypertension syndrome (HCC) 03/08/2022   Essential hypertension 03/07/2022   Morbid obesity (HCC) 03/07/2022   Family history  of early CAD 03/07/2022   Current every day vaping 03/07/2022   History of tobacco abuse 03/07/2022   Menorrhagia with regular cycle 05/03/2017   Family history of breast cancer 04/04/2017   BRCA gene mutation negative 04/04/2017    Social History   Tobacco Use   Smoking status: Former    Current packs/day: 0.00    Average packs/day: 0.5 packs/day for 2.0 years (1.0 ttl pk-yrs)    Types: Cigarettes    Start date: 05/04/2001    Quit date: 05/05/2003    Years since quitting: 21.4   Smokeless tobacco: Never  Substance Use Topics   Alcohol use: No     Current Outpatient Medications:    blood glucose meter kit and supplies, Dispense based on patient and insurance preference. Use to check blood sugars up to four times daily as directed. (FOR ICD-10 E11.9)., Disp: 1 each, Rfl: 0   fluticasone  (FLONASE ) 50 MCG/ACT nasal spray, Place 2 sprays into both nostrils daily., Disp: 16 g, Rfl: 6   loratadine (CLARITIN) 10 MG tablet, Take 10 mg by mouth daily., Disp: , Rfl:    losartan  (COZAAR ) 50 MG tablet, TAKE 1 TABLET BY MOUTH EVERYDAY AT BEDTIME, Disp: 30 tablet, Rfl: 5   metFORMIN  (GLUCOPHAGE -XR) 500 MG 24 hr tablet, TAKE 1 TABLET BY MOUTH EVERY DAY WITH BREAKFAST, Disp: 90 tablet, Rfl: 1   mometasone  (ELOCON ) 0.1 % cream, Apply tiwce daily to ear canals with q tip for eczema, Disp: 15 g, Rfl: 1   ONETOUCH VERIO test strip, USE TO CHECK BLOOD SUGAR UP TO  FOUR TIMES A DAY, Disp: 100 strip, Rfl: 6   rosuvastatin  (CRESTOR ) 10 MG tablet, Take 1 tablet (10 mg total) by mouth daily., Disp: 90 tablet, Rfl: 3   tirzepatide  (MOUNJARO ) 5 MG/0.5ML Pen, Inject 5 mg into the skin once a week., Disp: 6 mL, Rfl: 2  No Known Allergies  ROS  Ten systems reviewed and is negative except as mentioned in HPI    Objective  Vitals:   10/07/24 1000  BP: 132/78  Pulse: (!) 107  Resp: 16  Temp: 98.1 F (36.7 C)  SpO2: 98%  Weight: 182 lb (82.6 kg)  Height: 5' (1.524 m)    Body mass index is 35.54  kg/m.   Physical Exam CONSTITUTIONAL: Patient appears well-developed and well-nourished. No distress. HEENT: Head atraumatic, normocephalic, neck supple. Boggy turbinates  CARDIOVASCULAR: Normal rate, regular rhythm and normal heart sounds. No murmur heard. No BLE edema. PULMONARY: Effort normal and breath sounds normal. Lungs clear to auscultation bilaterally. No respiratory distress. ABDOMINAL: There is no tenderness or distention. MUSCULOSKELETAL: Normal gait. Without gross motor or sensory deficit. PSYCHIATRIC: Patient has a normal mood and affect. Behavior is normal. Judgment and thought content normal.  Assessment & Plan Acute viral upper respiratory infection with cough Acute viral upper respiratory infection with cough, likely viral etiology. Differential includes influenza and COVID-19. Rest and hydration emphasized. - Prescribed Tessalon Perles for cough, up to four tablets three times a day. - Recommended saline spray for congestion. - Advised use of DayQuil and NyQuil for symptom relief. - Provided time off work until afebrile for 24 hours without antipyretics. - Swabbed for influenza and COVID-19 was negative   Type 2 diabetes mellitus Type 2 diabetes mellitus. Consideration of sugar content in cough syrups noted. - Advised that sugar content in cough syrups is not a significant concern.

## 2024-10-16 ENCOUNTER — Ambulatory Visit: Admitting: Internal Medicine

## 2024-10-16 ENCOUNTER — Encounter: Payer: Self-pay | Admitting: Internal Medicine

## 2024-10-16 VITALS — BP 100/72 | HR 96 | Ht 60.0 in | Wt 182.2 lb

## 2024-10-16 DIAGNOSIS — I10 Essential (primary) hypertension: Secondary | ICD-10-CM | POA: Diagnosis not present

## 2024-10-16 DIAGNOSIS — R1030 Lower abdominal pain, unspecified: Secondary | ICD-10-CM

## 2024-10-16 DIAGNOSIS — E785 Hyperlipidemia, unspecified: Secondary | ICD-10-CM

## 2024-10-16 DIAGNOSIS — E119 Type 2 diabetes mellitus without complications: Secondary | ICD-10-CM

## 2024-10-16 DIAGNOSIS — E669 Obesity, unspecified: Secondary | ICD-10-CM | POA: Diagnosis not present

## 2024-10-16 DIAGNOSIS — Z23 Encounter for immunization: Secondary | ICD-10-CM | POA: Diagnosis not present

## 2024-10-16 DIAGNOSIS — Z7985 Long-term (current) use of injectable non-insulin antidiabetic drugs: Secondary | ICD-10-CM

## 2024-10-16 MED ORDER — METFORMIN HCL ER 500 MG PO TB24: ORAL_TABLET | ORAL | 3 refills | Status: AC

## 2024-10-16 MED ORDER — MOUNJARO 2.5 MG/0.5ML ~~LOC~~ SOAJ: 2.5000 mg | SUBCUTANEOUS | 2 refills | Status: AC

## 2024-10-16 MED ORDER — LOSARTAN POTASSIUM 25 MG PO TABS: 25.0000 mg | ORAL_TABLET | Freq: Every day | ORAL | 1 refills | Status: AC

## 2024-10-16 MED ORDER — ROSUVASTATIN CALCIUM 10 MG PO TABS: 10.0000 mg | ORAL_TABLET | Freq: Every day | ORAL | 3 refills | Status: AC

## 2024-10-16 NOTE — Assessment & Plan Note (Signed)
 She has resumed metformin  and stopped mounjaro  5 mg as of one month ago due to lower abdominal  pain followed by intense nausea  which has resolved.  She would like to resume at 2.5 mg dose.  Abdominal  exam is benign ; lipase is normal.   Lab Results  Component Value Date   LIPASE 21.0 10/16/2024

## 2024-10-16 NOTE — Assessment & Plan Note (Signed)
 Complicated by  diabetes and hypertension .  Previous weight loss of 30 lbs with Optavia diet.  Now iwith a 20 lb weight loss since starting   Mounjaro ,, followed by weight gain

## 2024-10-16 NOTE — Assessment & Plan Note (Signed)
 Bp has been low with weight loss.  Reducing losartan  to 25 mg daily

## 2024-10-16 NOTE — Patient Instructions (Signed)
 I have reduced your dose of losartan    Please check your blood pressure  ONCE A WEEK at home and send me the readings  in one month so I can determine if you are still at goal   Mounjaro  restart at 2.5 mg dose  If your abdominal pain becomes localized to the center of stomach, notify me immediately .   All refills have been sent to [facility] order   Happy thanksgiving!

## 2024-10-16 NOTE — Progress Notes (Unsigned)
 Subjective:  Patient ID: Christie Wright, female    DOB: [birth year]  Age: 43 y.o. MRN: [id number]  CC: The primary encounter diagnosis was Hyperlipidemia, unspecified hyperlipidemia type. Diagnoses of Obesity, diabetes, and hypertension syndrome (HCC), Morbid obesity (HCC), Lower abdominal pain, and Essential hypertension were also pertinent to this visit.   HPI Christie Wright presents for  Chief Complaint  Patient presents with   Medical Management of Chronic Issues    6 month follow up    1) Type 2 DM .obestiy/hypertension :  She  feels generally well,  But is not  exercising due to recent URI.  She is  trying to lose weight. Checking  blood sugars less than once daily at variable times, usually only if she feels she may be having a hypoglycemic event. .  BS have been under 130 fasting and < 150 post prandially.  Denies any recent hypoglyemic events.  Taking   medications as directed. Following a carbohydrate modified diet 6 days per week. Denies numbness, burning and tingling of extremities. Appetite is good.   not tolerating Mounjaro  even with reduction to 5 mg dose.  Has regained weight and wants to resume at lower dose  2) Patient is taking her medications as prescribed and notes no adverse effects.  Home BP readings have been done about once per week and are  generally < 110/70 .  She is avoiding added salt in her diet and walking regularly about 3 times per week for exercise  .   3( Recent URI:  recovering without complications.    Outpatient Medications Prior to Visit  Medication Sig Dispense Refill   benzonatate  (TESSALON ) 100 MG capsule Take 1-2 capsules (100-200 mg total) by mouth 3 (three) times daily as needed for cough. 40 capsule 0   blood glucose meter kit and supplies Dispense based on patient and insurance preference. Use to check blood sugars up to four times daily as directed. (FOR ICD-10 E11.9). 1 each 0   chlorpheniramine-HYDROcodone (TUSSIONEX) 10-8 MG/5ML Take  5 mLs by mouth at bedtime as needed. 115 mL 0   fluticasone  (FLONASE ) 50 MCG/ACT nasal spray Place 2 sprays into both nostrils daily. 16 g 6   loratadine (CLARITIN) 10 MG tablet Take 10 mg by mouth daily.     mometasone  (ELOCON ) 0.1 % cream Apply tiwce daily to ear canals with q tip for eczema 15 g 1   ONETOUCH VERIO test strip USE TO CHECK BLOOD SUGAR UP TO FOUR TIMES A DAY 100 strip 6   losartan  (COZAAR ) 50 MG tablet TAKE 1 TABLET BY MOUTH EVERYDAY AT BEDTIME 30 tablet 5   metFORMIN  (GLUCOPHAGE -XR) 500 MG 24 hr tablet TAKE 1 TABLET BY MOUTH EVERY DAY WITH BREAKFAST 90 tablet 1   rosuvastatin  (CRESTOR ) 10 MG tablet Take 1 tablet (10 mg total) by mouth daily. 90 tablet 3   tirzepatide  (MOUNJARO ) 5 MG/0.5ML Pen Inject 5 mg into the skin once a week. 6 mL 2   No facility-administered medications prior to visit.    Review of Systems;  Patient denies headache, fevers, malaise, unintentional weight loss, skin rash, eye pain, sinus congestion and sinus pain, sore throat, dysphagia,  hemoptysis , cough, dyspnea, wheezing, chest pain, palpitations, orthopnea, edema, abdominal pain, nausea, melena, diarrhea, constipation, flank pain, dysuria, hematuria, urinary  Frequency, nocturia, numbness, tingling, seizures,  Focal weakness, Loss of consciousness,  Tremor, insomnia, depression, anxiety, and suicidal ideation.      Objective:  BP 100/72   Pulse  96   Ht 5' (1.524 m)   Wt 182 lb 3.2 oz (82.6 kg)   LMP 04/20/2017 (Exact Date)   SpO2 97%   BMI 35.58 kg/m   BP Readings from Last 3 Encounters:  10/16/24 100/72  10/07/24 132/78  04/12/24 106/78    Wt Readings from Last 3 Encounters:  10/16/24 182 lb 3.2 oz (82.6 kg)  10/07/24 182 lb (82.6 kg)  04/12/24 168 lb 12.8 oz (76.6 kg)    Physical Exam  Lab Results  Component Value Date   HGBA1C 5.3 04/12/2024   HGBA1C 5.5 04/12/2023   HGBA1C 6.3 10/12/2022    Lab Results  Component Value Date   CREATININE 0.67 04/12/2024   CREATININE  0.66 04/12/2023   CREATININE 0.68 10/12/2022    Lab Results  Component Value Date   WBC 6.2 04/12/2024   HGB 14.5 04/12/2024   HCT 43.3 04/12/2024   PLT 258 04/12/2024   GLUCOSE 88 04/12/2024   CHOL 242 (H) 04/12/2024   TRIG 245 (H) 04/12/2024   HDL 41 04/12/2024   LDLDIRECT 152 (H) 04/12/2024   LDLCALC 156 (H) 04/12/2024   ALT 13 04/12/2024   AST 15 04/12/2024   NA 138 04/12/2024   K 3.8 04/12/2024   CL 102 04/12/2024   CREATININE 0.67 04/12/2024   BUN 8 04/12/2024   CO2 22 04/12/2024   TSH 1.110 04/12/2024   HGBA1C 5.3 04/12/2024    CT ABDOMEN PELVIS W CONTRAST Result Date: 05/30/2022 CLINICAL DATA:  Acute abdominal pain. EXAM: CT ABDOMEN AND PELVIS WITH CONTRAST TECHNIQUE: Multidetector CT imaging of the abdomen and pelvis was performed using the standard protocol following bolus administration of intravenous contrast. RADIATION DOSE REDUCTION: This exam was performed according to the departmental dose-optimization program which includes automated exposure control, adjustment of the mA and/or kV according to patient size and/or use of iterative reconstruction technique. CONTRAST:  OMNIPAQUE  IOHEXOL  300 MG/ML  SOLN COMPARISON:  None Available. FINDINGS: Lower chest: No acute abnormality. Hepatobiliary: No focal liver abnormality is seen. No gallstones, gallbladder wall thickening, or biliary dilatation. Pancreas: Unremarkable. No pancreatic ductal dilatation or surrounding inflammatory changes. Spleen: Normal in size without focal abnormality. Adrenals/Urinary Tract: Adrenal glands are unremarkable. Kidneys are normal, without renal calculi, focal lesion, or hydronephrosis. Bladder is unremarkable. Stomach/Bowel: Stomach is within normal limits. Appendix appears normal. Moderate-to-severe colonic diverticulosis. Focal sigmoid colonic wall thickening with adjacent fat stranding consistent with acute diverticulitis. Fat stranding and small amount of extraluminal free air, (series 2,  image 65; series 5, image 66) concerning for early developing abscess. Vascular/Lymphatic: No significant vascular findings are present. No enlarged abdominal or pelvic lymph nodes. Reproductive: Status post hysterectomy. No adnexal masses. Other: No abdominal wall hernia or abnormality. No abdominopelvic ascites. Musculoskeletal: No acute or significant osseous findings. IMPRESSION: 1. Acute sigmoid colonic diverticulitis. Small amount of extraluminal free air with adjacent fat stranding concerning for early developing abscess. No drainable fluid collection at this time. Continued follow-up is recommended. 2. Normal appendix. 3. Evidence of nephrolithiasis or hydronephrosis. Electronically Signed   By: Imran  Ahmed D.O.   On: 05/30/2022 16:24    Assessment & Plan:  .Hyperlipidemia, unspecified hyperlipidemia type -     Lipid panel -     LDL cholesterol, direct  Obesity, diabetes, and hypertension syndrome (HCC) Assessment & Plan: She has resumed metformin  and stopped mounjaro  5 mg as of one month ago due to lower abd pain followed by intense nausea  which has resolved.  She would  like to resume at 2.5 mg dose.  Abd exam ins benign   Orders: -     Comprehensive metabolic panel with GFR -     Hemoglobin A1c  Morbid obesity (HCC) Assessment & Plan: Complicated by  diabetes and hypertension .  Previous weight loss of 30 lbs with Optavia diet.  Now iwith a 20 lb weight loss since starting   Mounjaro ,, followed by weight gain     Lower abdominal pain -     Lipase  Essential hypertension Assessment & Plan: Bp has been low with weight loss.  Reducing losartan  to 25 mg daily    Other orders -     metFORMIN  HCl ER; TAKE 1 TABLET BY MOUTH EVERY DAY WITH BREAKFAST  Dispense: 90 tablet; Refill: 3 -     Rosuvastatin  Calcium ; Take 1 tablet (10 mg total) by mouth daily.  Dispense: 90 tablet; Refill: 3 -     Losartan  Potassium; Take 1 tablet (25 mg total) by mouth daily.  Dispense: 90 tablet; Refill:  1 -     Mounjaro ; Inject 2.5 mg into the skin once a week.  Dispense: 6 mL; Refill: 2     I spent 34 minutes on the day of this face to face encounter reviewing patient's  most recent visit with cardiology,  nephrology,  and neurology,  prior relevant surgical and non surgical procedures, recent  labs and imaging studies, counseling on weight management,  reviewing the assessment and plan with patient, and post visit ordering and reviewing of  diagnostics and therapeutics with patient  .   Follow-up: No follow-ups on file.   Verneita LITTIE Kettering, MD

## 2024-10-17 LAB — COMPREHENSIVE METABOLIC PANEL WITH GFR
ALT: 17 U/L (ref 0–35)
AST: 13 U/L (ref 0–37)
Albumin: 4.2 g/dL (ref 3.5–5.2)
Alkaline Phosphatase: 75 U/L (ref 39–117)
BUN: 7 mg/dL (ref 6–23)
CO2: 28 meq/L (ref 19–32)
Calcium: 9.1 mg/dL (ref 8.4–10.5)
Chloride: 100 meq/L (ref 96–112)
Creatinine, Ser: 0.67 mg/dL (ref 0.40–1.20)
GFR: 107.38 mL/min (ref >60.00)
Glucose, Bld: 136 mg/dL — ABNORMAL HIGH (ref 70–99)
Potassium: 3.7 meq/L (ref 3.5–5.1)
Sodium: 138 meq/L (ref 135–145)
Total Bilirubin: 0.4 mg/dL (ref 0.2–1.2)
Total Protein: 6.7 g/dL (ref 6.0–8.3)

## 2024-10-17 LAB — LIPID PANEL
Cholesterol: 142 mg/dL (ref 0–200)
HDL: 38.4 mg/dL — ABNORMAL LOW (ref >39.00)
LDL Cholesterol: 59 mg/dL (ref 0–99)
NonHDL: 103.12
Total CHOL/HDL Ratio: 4
Triglycerides: 221 mg/dL — ABNORMAL HIGH (ref 0.0–149.0)
VLDL: 44.2 mg/dL — ABNORMAL HIGH (ref 0.0–40.0)

## 2024-10-17 LAB — LIPASE: Lipase: 21 U/L (ref 11.0–59.0)

## 2024-10-17 LAB — HEMOGLOBIN A1C: Hgb A1c MFr Bld: 5.4 % (ref 4.6–6.5)

## 2024-10-17 LAB — LDL CHOLESTEROL, DIRECT: Direct LDL: 81 mg/dL

## 2024-10-17 NOTE — Assessment & Plan Note (Signed)
 Reducing mounjaro  dose to 2.5 mg

## 2024-10-20 ENCOUNTER — Ambulatory Visit: Payer: Self-pay | Admitting: Internal Medicine

## 2024-12-04 ENCOUNTER — Telehealth: Admitting: Nurse Practitioner

## 2024-12-04 DIAGNOSIS — R6889 Other general symptoms and signs: Secondary | ICD-10-CM

## 2024-12-04 DIAGNOSIS — J069 Acute upper respiratory infection, unspecified: Secondary | ICD-10-CM | POA: Diagnosis not present

## 2024-12-04 MED ORDER — FLUTICASONE PROPIONATE 50 MCG/ACT NA SUSP: 2.0000 | Freq: Every day | NASAL | 6 refills | Status: AC

## 2024-12-04 MED ORDER — OSELTAMIVIR PHOSPHATE 75 MG PO CAPS: 75.0000 mg | ORAL_CAPSULE | Freq: Two times a day (BID) | ORAL | 0 refills | Status: AC

## 2024-12-04 MED ORDER — NAPROXEN 500 MG PO TABS: 500.0000 mg | ORAL_TABLET | Freq: Two times a day (BID) | ORAL | 0 refills | Status: AC | PRN

## 2024-12-04 NOTE — Progress Notes (Signed)
 E visit for Flu like symptoms   We are sorry that you are not feeling well.  Here is how we plan to help! Based on what you have shared with me it looks like you may have a respiratory virus that may be influenza.  Influenza or the flu is  an infection caused by a respiratory virus. The flu virus is highly contagious and persons who did not receive their yearly flu vaccination may catch the flu from close contact.  We have anti-viral medications to treat the viruses that cause this infection. They are not a cure and only shorten the course of the infection. These prescriptions are most effective when they are given within the first 2 days of flu symptoms. Antiviral medications are indicated if you have a high risk of complications from the flu. You should  also consider an antiviral medication if you are in close contact with someone who is at risk. These medications can help patients avoid complications from the flu but have side effects that you should know.   Possible side effects from Tamiflu  or oseltamivir  include nausea, vomiting, diarrhea, dizziness, headaches, eye redness, sleep problems or other respiratory symptoms. You should not take Tamiflu  if you have an allergy to oseltamivir  or any to the ingredients in Tamiflu .  Based upon your symptoms and potential risk factors I have prescribed Oseltamivir  (Tamiflu ).  It has been sent to your designated pharmacy.  You will take one 75 mg capsule orally twice a day for the next 5 days.   For nasal congestion, you may use an oral decongestant such as Mucinex D or if you have glaucoma or high blood pressure use plain Mucinex.  Saline nasal spray or nasal drops can help and can safely be used as often as needed for congestion.  If you have a sore or scratchy throat, use a saltwater gargle-  to  teaspoon of salt dissolved in a 4-ounce to 8-ounce glass of warm water.  Gargle the solution for approximately 15-30 seconds and then spit.  It is  important not to swallow the solution.  You can also use throat lozenges/cough drops and Chloraseptic spray to help with throat pain or discomfort.  Warm or cold liquids can also be helpful in relieving throat pain.  For headache, pain or general discomfort, you can use Ibuprofen or Tylenol  as directed.   Some authorities believe that zinc sprays or the use of Echinacea may shorten the course of your symptoms.  I have prescribed the following medications to help lessen symptoms: I have prescribed an anti-inflammatory - Naprosyn  500 mg. Take twice daily as needed for fever or body aches for 2 weeks and I have prescribed Fluticasone  nasal spray 2 sprays in each nostril one time per dayasal spray 2 sprays in each nostril one time per day this will help with your ear pain   You are to isolate at home until you have been fever-free for at least 24 hours without a fever-reducing medication, and symptoms have been steadily improving for 24 hours.  If you must be around other household members who do not have symptoms, you need to make sure that both you and the family members are masking consistently with a high-quality mask.  If you note any worsening of symptoms despite treatment, please seek an in-person evaluation ASAP. If you note any significant shortness of breath or any chest pain, please seek ED evaluation. Please do not delay care!  ANYONE WHO HAS FLU SYMPTOMS SHOULD: Stay home.  The flu is highly contagious and going out or to work exposes others! Be sure to drink plenty of fluids. Water is fine as well as fruit juices, sodas and electrolyte beverages. You may want to stay away from caffeine or alcohol. If you are nauseated, try taking small sips of liquids. How do you know if you are getting enough fluid? Your urine should be a pale yellow or almost colorless. Get rest. Taking a steamy shower or using a humidifier may help nasal congestion and ease sore throat pain. Using a saline nasal spray  works much the same way. Cough drops, hard candies and sore throat lozenges may ease your cough. Line up a caregiver. Have someone check on you regularly.  GET HELP RIGHT AWAY IF: You cannot keep down liquids or your medications. You become short of breath Your fell like you are going to pass out or loose consciousness. Your symptoms persist after you have completed your treatment plan  MAKE SURE YOU  Understand these instructions. Will watch your condition. Will get help right away if you are not doing well or get worse.  Your e-visit answers were reviewed by a board certified advanced clinical practitioner to complete your personal care plan.  Depending on the condition, your plan could have included both over the counter or prescription medications.  If there is a problem please reply  once you have received a response from your provider.  Your safety is important to us .  If you have drug allergies check your prescription carefully.    You can use MyChart to ask questions about todays visit, request a non-urgent call back, or ask for a work or school excuse for 24 hours related to this e-Visit. If it has been greater than 24 hours you will need to follow up with your provider, or enter a new e-Visit to address those concerns.  You will get an e-mail in the next two days asking about your experience.  I hope that your e-visit has been valuable and will speed your recovery. Thank you for using e-visits.   I have spent 5 minutes in review of e-visit questionnaire, review and updating patient chart, medical decision making and response to patient.   Lauraine Kitty, FNP

## 2024-12-09 ENCOUNTER — Telehealth: Admitting: Physician Assistant

## 2024-12-09 DIAGNOSIS — B9689 Other specified bacterial agents as the cause of diseases classified elsewhere: Secondary | ICD-10-CM | POA: Diagnosis not present

## 2024-12-09 DIAGNOSIS — J019 Acute sinusitis, unspecified: Secondary | ICD-10-CM | POA: Diagnosis not present

## 2024-12-09 MED ORDER — PREDNISONE 10 MG (21) PO TBPK: ORAL_TABLET | ORAL | 0 refills | Status: AC

## 2024-12-09 MED ORDER — AMOXICILLIN-POT CLAVULANATE 875-125 MG PO TABS: 1.0000 | ORAL_TABLET | Freq: Two times a day (BID) | ORAL | 0 refills | Status: AC

## 2024-12-09 MED ORDER — PROMETHAZINE-DM 6.25-15 MG/5ML PO SYRP: 5.0000 mL | ORAL_SOLUTION | Freq: Four times a day (QID) | ORAL | 0 refills | Status: AC | PRN

## 2024-12-09 NOTE — Progress Notes (Signed)
 " Virtual Visit Consent   Christie Wright, you are scheduled for a virtual visit with a [facility] provider today. Just as with appointments in the office, your consent must be obtained to participate. Your consent will be active for this visit and any virtual visit you may have with one of our providers in the next 365 days. If you have a MyChart account, a copy of this consent can be sent to you electronically.  As this is a virtual visit, video technology does not allow for your provider to perform a traditional examination. This may limit your provider's ability to fully assess your condition. If your provider identifies any concerns that need to be evaluated in person or the need to arrange testing (such as labs, EKG, etc.), we will make arrangements to do so. Although advances in technology are sophisticated, we cannot ensure that it will always work on either your end or our end. If the connection with a video visit is poor, the visit may have to be switched to a telephone visit. With either a video or telephone visit, we are not always able to ensure that we have a secure connection.  By engaging in this virtual visit, you consent to the provision of healthcare and authorize for your insurance to be billed (if applicable) for the services provided during this visit. Depending on your insurance coverage, you may receive a charge related to this service.  I need to obtain your verbal consent now. Are you willing to proceed with your visit today? ARES TEGTMEYER has provided verbal consent on 12/09/2024 for a virtual visit (video or telephone). Christie Wright, [place]  Date: 12/09/2024 1:37 PM   Virtual Visit via Video Note   I, Christie Wright, connected with  ALEENE SWANNER  ([id number], Jun 21, 1981) on 12/09/2024 at  1:30 PM EST by a video-enabled telemedicine application and verified that I am speaking with the correct person using two identifiers.  Location: Patient: Virtual  Visit Location Patient: Home Provider: Virtual Visit Location Provider: Home Office   I discussed the limitations of evaluation and management by telemedicine and the availability of in person appointments. The patient expressed understanding and agreed to proceed.    History of Present Illness: RACHELLE Wright is a 44 y.o. who identifies as a female who was assigned female at birth, and is being seen today for continued and progressive symptoms after recent treatment for suspected influenza. Notes initial aches, chills resolve but progressive sinus pressure, sinus pain with thick yellow/green discharge that is hard to get out, a persistent dry cough affecting sleep, and now with substantial R ear pain and some muffled hearing.  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  Resting comfortably  at home.  Head is normocephalic, atraumatic.  No labored breathing.  Speech is clear and coherent with logical content.  Patient is alert and oriented at baseline.   Assessment and Plan: 1. Acute bacterial sinusitis (Primary) - predniSONE  (STERAPRED UNI-PAK 21 TAB) 10 MG (21) TBPK tablet; Take following package directions  Dispense: 21 tablet; Refill: 0 - amoxicillin -clavulanate (AUGMENTIN ) 875-125  MG tablet; Take 1 tablet by mouth 2 (two) times daily.  Dispense: 14 tablet; Refill: 0 - promethazine -dextromethorphan (PROMETHAZINE -DM) 6.25-15 MG/5ML syrup; Take 5 mLs by mouth 4 (four) times daily as needed for cough.  Dispense: 118 mL; Refill: 0  Rx Augmentin .  Increase fluids.  Rest.  Saline nasal spray.  Probiotic.  Mucinex as directed.  Humidifier in bedroom. Prednisone  pack and Promethazine -DM per orders.  Call or return to clinic if symptoms are not improving.   Follow Up Instructions: I discussed the assessment and treatment plan with the patient. The patient was provided an opportunity to ask questions and all were answered. The patient agreed with the plan and demonstrated an understanding of the instructions.  A copy of instructions were sent to the patient via MyChart unless otherwise noted below.   The patient was advised to call back or seek an in-person evaluation if the symptoms worsen or if the condition fails to improve as anticipated.    Christie Velma Lunger, PA-C    [1] No Known Allergies [2]  Current Outpatient Medications:    amoxicillin -clavulanate (AUGMENTIN ) 875-125 MG tablet, Take 1 tablet by mouth 2 (two) times daily., Disp: 14 tablet, Rfl: 0   predniSONE  (STERAPRED UNI-PAK 21 TAB) 10 MG (21) TBPK tablet, Take following package directions, Disp: 21 tablet, Rfl: 0   promethazine -dextromethorphan (PROMETHAZINE -DM) 6.25-15 MG/5ML syrup, Take 5 mLs by mouth 4 (four) times daily as needed for cough., Disp: 118 mL, Rfl: 0   blood glucose meter kit and supplies, Dispense based on patient and insurance preference. Use to check blood sugars up to four times daily as directed. (FOR ICD-10 E11.9)., Disp: 1 each, Rfl: 0   fluticasone  (FLONASE ) 50 MCG/ACT nasal spray, Place 2 sprays into both nostrils daily., Disp: 16 g, Rfl: 6   fluticasone  (FLONASE ) 50 MCG/ACT nasal spray, Place 2 sprays into both nostrils daily., Disp: 16 g, Rfl: 6   loratadine (CLARITIN) 10 MG tablet, Take  10 mg by mouth daily., Disp: , Rfl:    losartan  (COZAAR ) 25 MG tablet, Take 1 tablet (25 mg total) by mouth daily., Disp: 90 tablet, Rfl: 1   metFORMIN  (GLUCOPHAGE -XR) 500 MG 24 hr tablet, TAKE 1 TABLET BY MOUTH EVERY DAY WITH BREAKFAST, Disp: 90 tablet, Rfl: 3   mometasone  (ELOCON ) 0.1 % cream, Apply tiwce daily to ear canals with q tip for eczema, Disp: 15 g, Rfl: 1   naproxen  (NAPROSYN ) 500 MG tablet, Take 1 tablet (500 mg total) by mouth 2 (two) times daily as needed for up to 10 days for moderate pain (pain score 4-6)., Disp: 20 tablet, Rfl: 0   ONETOUCH VERIO test strip, USE TO CHECK BLOOD SUGAR UP TO FOUR TIMES A DAY, Disp: 100 strip, Rfl: 6   oseltamivir  (TAMIFLU ) 75 MG capsule, Take 1 capsule (75 mg total) by mouth 2 (two) times daily for 5 days., Disp: 10 capsule, Rfl: 0   rosuvastatin  (CRESTOR ) 10 MG tablet, Take 1 tablet (10 mg total) by mouth daily., Disp: 90 tablet, Rfl: 3   tirzepatide  (MOUNJARO ) 2.5 MG/0.5ML Pen, Inject 2.5 mg into the skin once a week., Disp: 6 mL, Rfl: 2  "

## 2024-12-09 NOTE — Patient Instructions (Signed)
 " Christie Wright, thank you for joining Christie Velma Lunger, PA-C for today's virtual visit.  While this provider is not your primary care provider (PCP), if your PCP is located in our provider database this encounter information will be shared with them immediately following your visit.   A [facility] MyChart account gives you access to today's visit and all your visits, tests, and labs performed at [facility]  click here if you don't have a [facility] MyChart account or go to [email or web address]  Consent: (Patient) Christie Wright provided verbal consent for this virtual visit at the beginning of the encounter.  Current Medications:  Current Outpatient Medications:    benzonatate  (TESSALON ) 100 MG capsule, Take 1-2 capsules (100-200 mg total) by mouth 3 (three) times daily as needed for cough., Disp: 40 capsule, Rfl: 0   blood glucose meter kit and supplies, Dispense based on patient and insurance preference. Use to check blood sugars up to four times daily as directed. (FOR ICD-10 E11.9)., Disp: 1 each, Rfl: 0   chlorpheniramine-HYDROcodone (TUSSIONEX) 10-8 MG/5ML, Take 5 mLs by mouth at bedtime as needed., Disp: 115 mL, Rfl: 0   fluticasone  (FLONASE ) 50 MCG/ACT nasal spray, Place 2 sprays into both nostrils daily., Disp: 16 g, Rfl: 6   fluticasone  (FLONASE ) 50 MCG/ACT nasal spray, Place 2 sprays into both nostrils daily., Disp: 16 g, Rfl: 6   loratadine (CLARITIN) 10 MG tablet, Take 10 mg by mouth daily., Disp: , Rfl:    losartan  (COZAAR ) 25 MG tablet, Take 1 tablet (25 mg total) by mouth daily., Disp: 90 tablet, Rfl: 1   metFORMIN  (GLUCOPHAGE -XR) 500 MG 24 hr tablet, TAKE 1 TABLET BY MOUTH EVERY DAY WITH BREAKFAST, Disp: 90 tablet, Rfl: 3   mometasone  (ELOCON ) 0.1 % cream, Apply tiwce daily to ear canals with q tip for eczema, Disp: 15 g, Rfl: 1   naproxen  (NAPROSYN ) 500 MG tablet, Take 1 tablet (500 mg total) by mouth 2 (two) times daily as needed for up to 10 days  for moderate pain (pain score 4-6)., Disp: 20 tablet, Rfl: 0   ONETOUCH VERIO test strip, USE TO CHECK BLOOD SUGAR UP TO FOUR TIMES A DAY, Disp: 100 strip, Rfl: 6   oseltamivir  (TAMIFLU ) 75 MG capsule, Take 1 capsule (75 mg total) by mouth 2 (two) times daily for 5 days., Disp: 10 capsule, Rfl: 0   rosuvastatin  (CRESTOR ) 10 MG tablet, Take 1 tablet (10 mg total) by mouth daily., Disp: 90 tablet, Rfl: 3   tirzepatide  (MOUNJARO ) 2.5 MG/0.5ML Pen, Inject 2.5 mg into the skin once a week., Disp: 6 mL, Rfl: 2   Medications ordered in this encounter:  No orders of the defined types were placed in this encounter.    *If you need refills on other medications prior to your next appointment, please contact your pharmacy*  Follow-Up: Call back or seek an in-person evaluation if the symptoms worsen or if the condition fails to improve as anticipated.  [facility] [phone]  Other Instructions Please take antibiotic as directed.  Increase fluid intake.  Use Saline nasal spray.  Take a daily multivitamin. Continue the Flonase . Start the steroid Wright, cough medication as directed.  Place a humidifier in the bedroom.  Please call or return clinic if symptoms are not improving.  Sinusitis Sinusitis is redness, soreness, and swelling (inflammation) of the paranasal sinuses. Paranasal sinuses are air pockets within the bones of your face (beneath the eyes, the middle of the  forehead, or above the eyes). In healthy paranasal sinuses, mucus is able to drain out, and air is able to circulate through them by way of your nose. However, when your paranasal sinuses are inflamed, mucus and air can become trapped. This can allow bacteria and other germs to grow and cause infection. Sinusitis can develop quickly and last only a short time (acute) or continue over a long period (chronic). Sinusitis that lasts for more than 12 weeks is considered chronic.  CAUSES  Causes of sinusitis  include: Allergies. Structural abnormalities, such as displacement of the cartilage that separates your nostrils (deviated septum), which can decrease the air flow through your nose and sinuses and affect sinus drainage. Functional abnormalities, such as when the small hairs (cilia) that line your sinuses and help remove mucus do not work properly or are not present. SYMPTOMS  Symptoms of acute and chronic sinusitis are the same. The primary symptoms are pain and pressure around the affected sinuses. Other symptoms include: Upper toothache. Earache. Headache. Bad breath. Decreased sense of smell and taste. A cough, which worsens when you are lying flat. Fatigue. Fever. Thick drainage from your nose, which often is green and may contain pus (purulent). Swelling and warmth over the affected sinuses. DIAGNOSIS  Your caregiver will perform a physical exam. During the exam, your caregiver may: Look in your nose for signs of abnormal growths in your nostrils (nasal polyps). Tap over the affected sinus to check for signs of infection. View the inside of your sinuses (endoscopy) with a special imaging device with a light attached (endoscope), which is inserted into your sinuses. If your caregiver suspects that you have chronic sinusitis, one or more of the following tests may be recommended: Allergy tests. Nasal culture A sample of mucus is taken from your nose and sent to a lab and screened for bacteria. Nasal cytology A sample of mucus is taken from your nose and examined by your caregiver to determine if your sinusitis is related to an allergy. TREATMENT  Most cases of acute sinusitis are related to a viral infection and will resolve on their own within 10 days. Sometimes medicines are prescribed to help relieve symptoms (pain medicine, decongestants, nasal steroid sprays, or saline sprays).  However, for sinusitis related to a bacterial infection, your caregiver will prescribe antibiotic  medicines. These are medicines that will help kill the bacteria causing the infection.  Rarely, sinusitis is caused by a fungal infection. In theses cases, your caregiver will prescribe antifungal medicine. For some cases of chronic sinusitis, surgery is needed. Generally, these are cases in which sinusitis recurs more than 3 times per year, despite other treatments. HOME CARE INSTRUCTIONS  Drink plenty of water. Water helps thin the mucus so your sinuses can drain more easily. Use a humidifier. Inhale steam 3 to 4 times a day (for example, sit in the bathroom with the shower running). Apply a warm, moist washcloth to your face 3 to 4 times a day, or as directed by your caregiver. Use saline nasal sprays to help moisten and clean your sinuses. Take over-the-counter or prescription medicines for pain, discomfort, or fever only as directed by your caregiver. SEEK IMMEDIATE MEDICAL CARE IF: You have increasing pain or severe headaches. You have nausea, vomiting, or drowsiness. You have swelling around your face. You have vision problems. You have a stiff neck. You have difficulty breathing. MAKE SURE YOU:  Understand these instructions. Will watch your condition. Will get help right away if you are not  doing well or get worse. Document Released: 11/14/2005 Document Revised: 02/06/2012 Document Reviewed: 11/29/2011 [facility] Patient Information 2014 [place], [place].    If you have been instructed to have an in-person evaluation today at a local Urgent Care facility, please use the link below. It will take you to a list of all of our available [facility], including address, phone number and hours of operation. Please do not delay care.  [facility]  If you or a family member do not have a primary care provider, use the link below to schedule a visit and establish care. When you choose a [facility] [address] primary care physician or advanced practice provider, you gain a  long-term partner in health. Find a Primary Care Provider  Learn more about [facility]'s in-office and virtual care options: [place] - Get Care Now  "

## 2025-04-16 ENCOUNTER — Ambulatory Visit: Admitting: Internal Medicine
